# Patient Record
Sex: Female | Born: 1985 | Race: Black or African American | Hispanic: No | Marital: Single | Smoking: Never smoker
Health system: Southern US, Community
[De-identification: ages and names within clinical notes are randomized; demographics above are authoritative.]

## PROBLEM LIST (undated history)

## (undated) ENCOUNTER — Inpatient Hospital Stay (HOSPITAL_COMMUNITY): Payer: Self-pay

## (undated) DIAGNOSIS — O09893 Supervision of other high risk pregnancies, third trimester: Secondary | ICD-10-CM

## (undated) DIAGNOSIS — O0930 Supervision of pregnancy with insufficient antenatal care, unspecified trimester: Secondary | ICD-10-CM

## (undated) DIAGNOSIS — O99810 Abnormal glucose complicating pregnancy: Secondary | ICD-10-CM

## (undated) DIAGNOSIS — B009 Herpesviral infection, unspecified: Secondary | ICD-10-CM

## (undated) HISTORY — PX: NO PAST SURGERIES: SHX2092

## (undated) HISTORY — DX: Supervision of pregnancy with insufficient antenatal care, unspecified trimester: O09.30

## (undated) HISTORY — DX: Supervision of other high risk pregnancies, third trimester: O09.893

## (undated) HISTORY — DX: Abnormal glucose complicating pregnancy: O99.810

## (undated) HISTORY — DX: Herpesviral infection, unspecified: B00.9

---

## 2015-01-01 ENCOUNTER — Encounter (HOSPITAL_COMMUNITY): Payer: Self-pay | Admitting: Emergency Medicine

## 2015-01-01 ENCOUNTER — Emergency Department (HOSPITAL_COMMUNITY): Payer: Medicaid Other

## 2015-01-01 ENCOUNTER — Emergency Department (HOSPITAL_COMMUNITY)
Admission: EM | Admit: 2015-01-01 | Discharge: 2015-01-01 | Disposition: A | Discharge status: Home / Self Care | Payer: Medicaid Other | Attending: Emergency Medicine | Admitting: Emergency Medicine

## 2015-01-01 ENCOUNTER — Emergency Department (HOSPITAL_COMMUNITY): Payer: Self-pay

## 2015-01-01 DIAGNOSIS — R1032 Left lower quadrant pain: Secondary | ICD-10-CM | POA: Insufficient documentation

## 2015-01-01 DIAGNOSIS — O99213 Obesity complicating pregnancy, third trimester: Secondary | ICD-10-CM | POA: Insufficient documentation

## 2015-01-01 DIAGNOSIS — O9989 Other specified diseases and conditions complicating pregnancy, childbirth and the puerperium: Secondary | ICD-10-CM | POA: Insufficient documentation

## 2015-01-01 DIAGNOSIS — E669 Obesity, unspecified: Secondary | ICD-10-CM | POA: Insufficient documentation

## 2015-01-01 DIAGNOSIS — R1031 Right lower quadrant pain: Secondary | ICD-10-CM | POA: Insufficient documentation

## 2015-01-01 DIAGNOSIS — R102 Pelvic and perineal pain: Secondary | ICD-10-CM

## 2015-01-01 DIAGNOSIS — R52 Pain, unspecified: Secondary | ICD-10-CM

## 2015-01-01 DIAGNOSIS — O26899 Other specified pregnancy related conditions, unspecified trimester: Secondary | ICD-10-CM

## 2015-01-01 DIAGNOSIS — R109 Unspecified abdominal pain: Secondary | ICD-10-CM

## 2015-01-01 DIAGNOSIS — Z590 Homelessness: Secondary | ICD-10-CM | POA: Insufficient documentation

## 2015-01-01 LAB — COMPREHENSIVE METABOLIC PANEL
ALK PHOS: 65 U/L (ref 38–126)
ALT: 77 U/L — ABNORMAL HIGH (ref 14–54)
ANION GAP: 15 (ref 5–15)
AST: 59 U/L — ABNORMAL HIGH (ref 15–41)
Albumin: 4.5 g/dL (ref 3.5–5.0)
BUN: 12 mg/dL (ref 6–20)
CALCIUM: 9.9 mg/dL (ref 8.9–10.3)
CO2: 22 mmol/L (ref 22–32)
Chloride: 97 mmol/L — ABNORMAL LOW (ref 101–111)
Creatinine, Ser: 0.99 mg/dL (ref 0.44–1.00)
GFR calc non Af Amer: >60 mL/min (ref >60)
Glucose, Bld: 405 mg/dL — ABNORMAL HIGH (ref 65–99)
Potassium: 4.5 mmol/L (ref 3.5–5.1)
Sodium: 134 mmol/L — ABNORMAL LOW (ref 135–145)
TOTAL PROTEIN: 7.3 g/dL (ref 6.5–8.1)
Total Bilirubin: 1.3 mg/dL — ABNORMAL HIGH (ref 0.3–1.2)

## 2015-01-01 LAB — TYPE AND SCREEN
ABO/RH(D): O POS
ANTIBODY SCREEN: NEGATIVE

## 2015-01-01 LAB — RAPID HIV SCREEN (HIV 1/2 AB+AG)
HIV 1/2 Antibodies: NONREACTIVE
HIV-1 P24 Antigen - HIV24: NONREACTIVE

## 2015-01-01 LAB — URINALYSIS, ROUTINE W REFLEX MICROSCOPIC
Glucose, UA: NEGATIVE mg/dL
Hgb urine dipstick: NEGATIVE
Ketones, ur: 40 mg/dL — AB
NITRITE: NEGATIVE
Protein, ur: NEGATIVE mg/dL
SPECIFIC GRAVITY, URINE: 1.028 (ref 1.005–1.030)
pH: 6 (ref 5.0–8.0)

## 2015-01-01 LAB — CBC
HCT: 46.9 % — ABNORMAL HIGH (ref 36.0–46.0)
HEMOGLOBIN: 16.7 g/dL — AB (ref 12.0–15.0)
MCH: 30.6 pg (ref 26.0–34.0)
MCHC: 35.6 g/dL (ref 30.0–36.0)
MCV: 85.9 fL (ref 78.0–100.0)
Platelets: 265 10*3/uL (ref 150–400)
RBC: 5.46 MIL/uL — AB (ref 3.87–5.11)
RDW: 12.7 % (ref 11.5–15.5)
WBC: 7.2 10*3/uL (ref 4.0–10.5)

## 2015-01-01 LAB — URINE MICROSCOPIC-ADD ON

## 2015-01-01 LAB — WET PREP, GENITAL
Clue Cells Wet Prep HPF POC: NONE SEEN
SPERM: NONE SEEN
TRICH WET PREP: NONE SEEN
WBC WET PREP: NONE SEEN
YEAST WET PREP: NONE SEEN

## 2015-01-01 LAB — ABO/RH: ABO/RH(D): O POS

## 2015-01-01 LAB — I-STAT BETA HCG BLOOD, ED (MC, WL, AP ONLY): HCG, QUANTITATIVE: 1000.7 m[IU]/mL — AB (ref <5)

## 2015-01-01 MED ORDER — SODIUM CHLORIDE 0.9 % IV BOLUS (SEPSIS)
1000.0000 mL | Freq: Once | INTRAVENOUS | Status: AC
  Administered 2015-01-01: 1000 mL via INTRAVENOUS

## 2015-01-01 MED ORDER — SODIUM CHLORIDE 0.9 % IV BOLUS (SEPSIS): 1000.0000 mL | Freq: Once | INTRAVENOUS | Status: DC

## 2015-01-01 MED ORDER — ACETAMINOPHEN 325 MG PO TABS
650.0000 mg | ORAL_TABLET | Freq: Once | ORAL | Status: AC
  Administered 2015-01-01: 650 mg via ORAL
  Filled 2015-01-01: qty 2

## 2015-01-01 NOTE — ED Notes (Signed)
Pt returned from ultrasound

## 2015-01-01 NOTE — Discharge Instructions (Signed)
[name],  Nice meeting you! Return to the emergency department if your abdominal pain gets worse or if you notice vaginal bleeding. You may take Tylenol for pain. Feel better soon!  S. Lane HackerNicole Keyleigh Manninen, PA-C

## 2015-01-01 NOTE — ED Notes (Signed)
Discharge instructions and follow up care reviewed with patient. Patient verbalized understanding. 

## 2015-01-01 NOTE — ED Notes (Signed)
Unable to capture tracing in obix; it continues to put tracing on hold every time RN tries to resume tracing; pt has c/o pelvic pain; states that it is a 10 when she walks; pt reports spotting since Friday; reports last intercourse was in May; reports no leaking of fluid, no itching or burning, no foul smell.

## 2015-01-01 NOTE — ED Provider Notes (Signed)
The patient is a morbidly obese 29 year old female who is pregnant with her sixth pregnancy, she has only recently come to establish some kind of medical follow-up but does not yet have an OB/GYN, she complains of pain in her lower pelvis over the last week, specifically on the right side, constant but worse when she stands, no urinary symptoms, no bowel symptoms, no vomiting, no fevers.  On exam she has a morbidly obese body habitus, she is pregnant with a palpable uterus above the umbilicus, she has tenderness minimally in the epigastrium but also in the right lower and left lower quadrant, there is no focality, no guarding, no peritoneal signs.  The patient's workup reveals that she has a 30 one week 2 day pregnancy in the uterus based on ultrasound, we will do fetal monitoring, will have to disposition based on fetal monitoring. Doubt appendicitis, more likely related to pregnancy.  Discuss with OB/GYN rapid response who has seen the patient, evaluated the fetal monitoring, there was no signs of contractions, the patient does not have urinary infection, she will be discharged home to follow up in the outpatient setting, she appears well, she is in agreement with the plan  Meds given in ED:  Medications  acetaminophen (TYLENOL) tablet 650 mg (650 mg Oral Given 01/01/15 1727)  sodium chloride 0.9 % bolus 1,000 mL (0 mLs Intravenous Stopped 01/01/15 1851)       Medical screening examination/treatment/procedure(s) were conducted as a shared visit with non-physician practitioner(s) and myself.  I personally evaluated the patient during the encounter.  Clinical Impression:   Final diagnoses:  Abdominal pain in pregnancy       Eber Hong'  Wyllow Seigler, MD 01/14/15 [phone]

## 2015-01-01 NOTE — ED Notes (Signed)
PA at bedside.

## 2015-01-01 NOTE — ED Notes (Signed)
Patient transported to Ultrasound 

## 2015-01-01 NOTE — ED Notes (Signed)
Nurse drawing labs. 

## 2015-01-01 NOTE — Progress Notes (Signed)
Medicaid family planning [place]: : [id number] (main) [email or web address]://dma.ncdhhs.gov/- As a Medicaid client you MUST contact DSS/SSI each time you change address, move to another county or another state to keep your address updated please review  https://scott-[email or web address]/http://dma.ncdhhs.gov/medicaid/get-started/find-programs-and-services/be-smart-family-planning-program This provides information on what is and is not covered

## 2015-01-01 NOTE — ED Notes (Signed)
OB rapid response notified. States they will come evaluate this patient.

## 2015-01-01 NOTE — ED Notes (Signed)
RROB spoke with Dr Lynetta MareAnyawu and told of fhr reactive, no contractions, labs performed and results from the u/a and wetprep; sve=closed/40, no blood or brown discharge on RN's glove after sve; orders that pt may be discharged with preterm labor precautions, pt needs to make an appointment with the health department or [facility]; RN gave preterm labor precautions and told to follow up at either place she would like; pt verbalized understanding.

## 2015-01-01 NOTE — ED Notes (Signed)
Per pt, states abdominal pain since last Friday-states she has been spotting-states last period in May-no prenatal care due to "moving a lot"

## 2015-01-01 NOTE — ED Notes (Signed)
OB rapid response at bedside 

## 2015-01-01 NOTE — ED Provider Notes (Signed)
CSN: [id number]     Arrival date & time 01/01/15  1034 History   First MD Initiated Contact with Patient 01/01/15 1504     Chief Complaint  Patient presents with  . Abdominal Pain    HPI   Melissa Kelly is a 29 y.o. F with no significant PMH presenting with a lower abdominal pain since last Friday. She describes her pain as constant, 6/10 pain scale, achy, bilateral lower abdominal but worse on the right. She has 5 living children, present at bedside, and she states this pain is similar to her previous pregnancies but more severe. She denies fevers, chills, CP, SOB, N/V, changes in bowel/bladder habits, prenatal care (she is homeless), alleviation attempts.   History reviewed. No pertinent past medical history. History reviewed. No pertinent past surgical history. No family history on file. Social History  Substance Use Topics  . Smoking status: Never Smoker   . Smokeless tobacco: None  . Alcohol Use: No   OB History    Gravida Para Term Preterm AB TAB SAB Ectopic Multiple Living   7 5 5  0 1 0 1 0 0 5     Review of Systems  Ten systems are reviewed and are negative for acute change except as noted in the HPI   Allergies  Review of patient's allergies indicates no known allergies.  Home Medications   Prior to Admission medications   Medication Sig Start Date End Date Taking? Authorizing Provider  ibuprofen (ADVIL,MOTRIN) 200 MG tablet Take 400 mg by mouth every 6 (six) hours as needed for headache or moderate pain.   Yes Historical Provider, MD   BP 126/71 mmHg  Pulse 70  Temp(Src) 98.2 F (36.8 C) (Oral)  Resp 18  SpO2 100%  LMP 07/01/2014 Physical Exam  Constitutional: She appears well-developed and well-nourished. No distress.  Obese  HENT:  Head: Normocephalic and atraumatic.  Mouth/Throat: Oropharynx is clear and moist. No oropharyngeal exudate.  Eyes: Conjunctivae are normal. Pupils are equal, round, and reactive to light. Right eye exhibits no  discharge. Left eye exhibits no discharge. No scleral icterus.  Neck: No tracheal deviation present.  Cardiovascular: Normal rate, regular rhythm, normal heart sounds and intact distal pulses.  Exam reveals no gallop and no friction rub.   No murmur heard. Pulmonary/Chest: Effort normal and breath sounds normal. No respiratory distress. She has no wheezes. She has no rales. She exhibits no tenderness.  Abdominal: Soft. Bowel sounds are normal. She exhibits no distension and no mass. There is tenderness. There is no rebound and no guarding.  RLQ and LLQ ttp.   Genitourinary:  Uterine fundus correlating with 20+ week gestation. Performed pelvic without speculum. No blood or discharge visualized on glove. Cervical os closed.    Musculoskeletal: She exhibits no edema.  Lymphadenopathy:    She has no cervical adenopathy.  Neurological: She is alert. Coordination normal.  Skin: Skin is warm and dry. No rash noted. She is not diaphoretic. No erythema.  Psychiatric: She has a normal mood and affect. Her behavior is normal.  Nursing note and vitals reviewed.   ED Course  Procedures  Labs Review Labs Reviewed  COMPREHENSIVE METABOLIC PANEL - Abnormal; Notable for the following:    Sodium 134 (*)    Chloride 97 (*)    Glucose, Bld 405 (*)    AST 59 (*)    ALT 77 (*)    Total Bilirubin 1.3 (*)    All other components within normal limits  CBC -  Abnormal; Notable for the following:    RBC 5.46 (*)    Hemoglobin 16.7 (*)    HCT 46.9 (*)    All other components within normal limits  I-STAT BETA HCG BLOOD, ED ([facility], [facility], [facility] ONLY) - Abnormal; Notable for the following:    I-stat hCG, quantitative 1000.7 (*)    All other components within normal limits  URINALYSIS, ROUTINE W REFLEX MICROSCOPIC (NOT AT [facility])  TYPE AND SCREEN    Imaging Review US Ob Limited  01/01/2015  CLINICAL DATA:  Abdominal pain in third trimester pregnancy. EXAM: LIMITED OBSTETRIC ULTRASOUND FINDINGS: Number of Fetuses: 1  Heart Rate:  136 bpm Movement: Yes per sonographer exam Presentation: Cephalic Placental Location: Fundal.  No evidence of subplacental collection. Previa: No Amniotic Fluid (Subjective):  Within normal limits. BPD:  7.8cm 31w  2d MATERNAL FINDINGS: Cervix:  Length uncertain due to shadowing from fetal cranium. Uterus/Adnexae: Ovaries not visualized. No pathologic findings in the adnexa. IMPRESSION: 1. Negative, as above. 2. This exam is performed on an emergent basis and does not comprehensively evaluate fetal size, dating, or anatomy; follow-up complete OB US should be considered if further fetal assessment is warranted. Electronically Signed   By: Marnee Spring M.D.   On: 01/01/2015 15:49   I have personally reviewed and evaluated these images and lab results as part of my medical decision-making.   MDM   Final diagnoses:  Pelvic pain in female  Pain   Patient non-toxic appearing and VSS. Patient in no apparent distress. Based on patient history and physical exam, most likely etiologies are pregnancy related abdominal pains, contractions. Less likely etiologies are appendicitis, ruptured ectopic pregnancy, ovarian torsion, ovarian cyst, PID/TOA, kidney stone, psoas abscess. Will give fluids, tylenol, and [place], as well as order fetal monitoring.   US demonstrates 31 week, 2 day fetus, unremarkable otherwise. Per RN note, fetal HR reactive, no contractions. Patient may be discharged with preterm labor precautions. Upon reevaluation, patient is feeling better. Stressed importance of OP follow-up with obstetrics.  Patient requesting a note for her to stay in her facility during the day (they tell guests to leave during the day) because it is difficult for her to walk. Unfortunately, I am unable to provide this for her, as her workup with [place] today does not indicate bedrest.  Patient may be safely discharged home. Discussed reasons for return. Patient to follow-up with obstetrics as soon as possible.  Patient in understanding and agreement with the plan.    Melton Krebs, PA-C 01/11/15 1552  Eber Hong, MD 01/14/15 [phone]

## 2015-01-02 LAB — GC/CHLAMYDIA PROBE AMP (~~LOC~~) NOT AT ARMC
CHLAMYDIA, DNA PROBE: NEGATIVE
Neisseria Gonorrhea: NEGATIVE

## 2015-01-02 LAB — RPR: RPR: NONREACTIVE

## 2015-01-03 ENCOUNTER — Encounter: Payer: Medicaid Other | Admitting: Student

## 2015-01-03 LAB — RUBELLA SCREEN: RUBELLA: 4.89 {index} (ref >0.99)

## 2015-01-03 LAB — HEPATITIS B SURFACE ANTIGEN: Hepatitis B Surface Ag: NEGATIVE

## 2015-01-15 DIAGNOSIS — O99280 Endocrine, nutritional and metabolic diseases complicating pregnancy, unspecified trimester: Secondary | ICD-10-CM

## 2015-01-15 DIAGNOSIS — R42 Dizziness and giddiness: Secondary | ICD-10-CM

## 2015-01-15 DIAGNOSIS — E639 Nutritional deficiency, unspecified: Secondary | ICD-10-CM

## 2015-01-16 ENCOUNTER — Encounter: Payer: Self-pay | Admitting: Advanced Practice Midwife

## 2015-01-16 ENCOUNTER — Ambulatory Visit (INDEPENDENT_AMBULATORY_CARE_PROVIDER_SITE_OTHER): Payer: Self-pay | Admitting: Advanced Practice Midwife

## 2015-01-16 VITALS — BP 139/88 | HR 89 | Temp 97.7°F | Ht 68.0 in | Wt 346.5 lb

## 2015-01-16 DIAGNOSIS — O09893 Supervision of other high risk pregnancies, third trimester: Secondary | ICD-10-CM

## 2015-01-16 DIAGNOSIS — O0930 Supervision of pregnancy with insufficient antenatal care, unspecified trimester: Secondary | ICD-10-CM

## 2015-01-16 DIAGNOSIS — O99810 Abnormal glucose complicating pregnancy: Secondary | ICD-10-CM

## 2015-01-16 DIAGNOSIS — Z8619 Personal history of other infectious and parasitic diseases: Secondary | ICD-10-CM

## 2015-01-16 DIAGNOSIS — Z8279 Family history of other congenital malformations, deformations and chromosomal abnormalities: Secondary | ICD-10-CM

## 2015-01-16 DIAGNOSIS — O0933 Supervision of pregnancy with insufficient antenatal care, third trimester: Secondary | ICD-10-CM

## 2015-01-16 DIAGNOSIS — Z23 Encounter for immunization: Secondary | ICD-10-CM

## 2015-01-16 HISTORY — DX: Supervision of other high risk pregnancies, third trimester: O09.893

## 2015-01-16 HISTORY — DX: Supervision of pregnancy with insufficient antenatal care, unspecified trimester: O09.30

## 2015-01-16 HISTORY — DX: Abnormal glucose complicating pregnancy: O99.810

## 2015-01-16 LAB — POCT URINALYSIS DIP (DEVICE)
GLUCOSE, UA: NEGATIVE mg/dL
HGB URINE DIPSTICK: NEGATIVE
Ketones, ur: >=160 mg/dL — AB
LEUKOCYTES UA: NEGATIVE
NITRITE: NEGATIVE
Protein, ur: 30 mg/dL — AB
Specific Gravity, Urine: >=1.03 (ref 1.005–1.030)
Urobilinogen, UA: 1 mg/dL (ref 0.0–1.0)
pH: 6 (ref 5.0–8.0)

## 2015-01-16 MED ORDER — ACYCLOVIR 200 MG PO CAPS: 400.0000 mg | ORAL_CAPSULE | Freq: Two times a day (BID) | ORAL | Status: DC

## 2015-01-16 MED ORDER — TETANUS-DIPHTH-ACELL PERTUSSIS 5-2.5-18.5 LF-MCG/0.5 IM SUSP
0.5000 mL | Freq: Once | INTRAMUSCULAR | Status: AC
  Administered 2015-01-16: 0.5 mL via INTRAMUSCULAR

## 2015-01-16 NOTE — Progress Notes (Signed)
1 hr GTT today.  Pt declines Flu vac, agrees to Tdap.

## 2015-01-16 NOTE — Congregational Nurse Program (Signed)
Initial  Visit  For this mother of  5 and is expecting her 6th child. EDC is 02-24-15. States  When ask has she made arrangements for  Her children to  Be taken care of if she  Goes to hospital for early  Or illness. She states I dont know  Any one , I have no friends here . [place]oved here 1 [place] ago . [place] and he will come when needed but  That may be after baby is delievered. Nurse tried to emphasize how  Important planning for her children is as her due  Date is very soon. Nurse talked with case managenement about this situation and they are awre of need. DSS would  Have to  Be called. i talked  With the mother about  DSS custody if  No plans are made and she stated I hate that. Has not been eating ,states just isnt hungry  And saves what  She  Has in her room for the children. Nurse will let  Case management know  And they stated some  Foods  Could  Be provide for family  To supplement meals in cafeteria .All members eating in the cafeteria.  Nurse counseled about the importance of  Nutrition for mother and baby.  Blood  Sugar taken 66 mg at  4;30 pm . Gave  Mom what  Normal blood sugar  And signs and symptoms of  Low sugar  which  She  was experiencing  . To  Eat a good dinner.  This  29  Year old  Mother of  5 children now  Seems depressed and  overwhelmed . Please elevate for the  possibly of  Depression  during pregnancy due to  Move ,no support system .  Nurse  made arrangements for  Family  To  Receive  Free coats for the  winter and provided a taxi voucher [facility]  [facility] and [facility].

## 2015-01-16 NOTE — Progress Notes (Signed)
Subjective:    Melissa HarnessSharlyne Kelly is a [id number] 1543w3d being seen today for her first obstetrical visit.  Her obstetrical history is significant for morbid obesity and late to care. [facility] Patient does intend to breast feed. Pregnancy history fully reviewed.  [facility]. Had prenatal panel drawn GC/Chlamydia collected. Random serum glucose was 405. Denies Hx DM before or during pregnancy.   Patient reports no complaints.  Filed Vitals:   01/16/15 0759 01/16/15 0801  BP: 139/88   Pulse: 89   Temp: 97.7 F (36.5 C)   Height:  5\' 8"  (1.727 m)  Weight: 346 lb 8 oz (157.171 kg)     HISTORY: OB History  Gravida Para Term Preterm AB SAB TAB Ectopic Multiple Living  7 5 5  0 1 1 0 0 0 5    # Outcome Date GA Lbr Len/2nd Weight Sex Delivery Anes PTL Lv  7 Current           6 SAB           5 Term           4 Term           3 Term           2 Term           1 Term              Past Medical History  Diagnosis Date  . Medical history non-contributory   . Herpes    History reviewed. No pertinent past surgical history. Family History  Problem Relation Age of Onset  . Tuberculosis Mother   . Stroke Father      Exam  BP 139/88 mmHg  Pulse 89  Temp(Src) 97.7 F (36.5 C)  Ht 5\' 8"  (1.727 m)  Wt 346 lb 8 oz (157.171 kg)  BMI 52.70 kg/m2  LMP 07/01/2014  NST reactive  Uterus:   36 cm fundal height  Pelvic Exam: deferred   Bony Pelvis: proven  System: Breast:  declined   Skin: normal coloration and turgor, no rashes    Neurologic: oriented, normal mood, grossly non-focal   Extremities: normal strength, tone, and muscle mass   HEENT sclera clear, anicteric   Mouth/Teeth mucous membranes moist, pharynx normal without lesions and dental hygiene good   Cardiovascular: regular rate and rhythm, no murmurs or gallops   Respiratory:  appears well, vitals normal, no respiratory distress, acyanotic, normal  RR, chest clear, no wheezing, crepitations, rhonchi, normal symmetric air entry   Abdomen: soft, non-tender; bowel sounds normal; no masses,  no organomegaly      Assessment:    Pregnancy: [id number]   1. Supervision of other high risk pregnancies, third trimester  - Glucose Tolerance, 1 HR (50g) - Culture, OB Urine - Prescript Monitor Profile(19) - US MFM OB COMP + 14 WK; Future - Hemoglobinopathy evaluation  2. Late prenatal care, third trimester  - US MFM OB COMP + 14 WK; Future - Fetal nonstress test - US MFM FETAL BPP WO NON STRESS; Future  3. Hyperglycemia during pregnancy  - Hemoglobin A1c - Fetal nonstress test - US MFM FETAL BPP WO NON STRESS; Future  4. History of herpes genitalis  - acyclovir (ZOVIRAX) 200 MG capsule; Take 2 capsules (400 mg total) by mouth 2 (two) times daily.  Dispense: 60 capsule; Refill: 6  5. FH of cardiac defect  - Will schedule fetal  echo  Plan:     Initial labs reviewed. Prenatal vitamins. Problem list reviewed and updated. Genetic Screening discussed Quad Screen: too late.  Ultrasound discussed; fetal survey: ordered.  Follow up in 1 weeks. Meet w/ DM educator 01/21/15 for probable GDM per consult w/ Dr. Macon Large. NST reactive today  [name] 01/16/2015

## 2015-01-17 DIAGNOSIS — Z8279 Family history of other congenital malformations, deformations and chromosomal abnormalities: Secondary | ICD-10-CM | POA: Insufficient documentation

## 2015-01-17 LAB — HEMOGLOBIN A1C
Hgb A1c MFr Bld: 5.1 % (ref <5.7)
MEAN PLASMA GLUCOSE: 100 mg/dL (ref <117)

## 2015-01-17 LAB — GLUCOSE TOLERANCE, 1 HOUR (50G) W/O FASTING: GLUCOSE 1 HOUR GTT: 101 mg/dL (ref 70–140)

## 2015-01-18 ENCOUNTER — Telehealth: Payer: Self-pay | Admitting: General Practice

## 2015-01-18 LAB — PRESCRIPTION MONITORING PROFILE (19 PANEL)
Amphetamine/Meth: NEGATIVE ng/mL
BARBITURATE SCREEN, URINE: NEGATIVE ng/mL
Benzodiazepine Screen, Urine: NEGATIVE ng/mL
Buprenorphine, Urine: NEGATIVE ng/mL
CARISOPRODOL, URINE: NEGATIVE ng/mL
COCAINE METABOLITES: NEGATIVE ng/mL
CREATININE, URINE: 376.79 mg/dL (ref >20.0)
Cannabinoid Scrn, Ur: NEGATIVE ng/mL
Fentanyl, Ur: NEGATIVE ng/mL
MDMA URINE: NEGATIVE ng/mL
Meperidine, Ur: NEGATIVE ng/mL
Methadone Screen, Urine: NEGATIVE ng/mL
Methaqualone: NEGATIVE ng/mL
Nitrites, Initial: NEGATIVE ug/mL
OPIATE SCREEN, URINE: NEGATIVE ng/mL
OXYCODONE SCRN UR: NEGATIVE ng/mL
PH URINE, INITIAL: 5.8 pH (ref 4.5–8.9)
PROPOXYPHENE: NEGATIVE ng/mL
Phencyclidine, Ur: NEGATIVE ng/mL
TAPENTADOLUR: NEGATIVE ng/mL
TRAMADOL UR: NEGATIVE ng/mL
Zolpidem, Urine: NEGATIVE ng/mL

## 2015-01-18 LAB — CULTURE, OB URINE

## 2015-01-18 NOTE — Telephone Encounter (Signed)
Per Melissa KinsmanVirginia Kelly, patient needs fetal echo due to family history of heart defect. Fetal echo scheduled 12/22 @ 1pm with [facility]. Called patient & informed her of appt and provided office information. Patient verbalized understanding & had no questions

## 2015-01-21 ENCOUNTER — Ambulatory Visit: Payer: Self-pay

## 2015-01-21 LAB — HEMOGLOBINOPATHY EVALUATION
HGB A: 97.6 % (ref 96.8–97.8)
HGB S QUANTITAION: 0 %
Hemoglobin Other: 0 %
Hgb A2 Quant: 2.4 % (ref 2.2–3.2)
Hgb F Quant: 0 % (ref 0.0–2.0)

## 2015-01-22 ENCOUNTER — Ambulatory Visit (HOSPITAL_COMMUNITY)
Admission: RE | Admit: 2015-01-22 | Discharge: 2015-01-22 | Disposition: A | Discharge status: Home / Self Care | Payer: Self-pay | Source: Ambulatory Visit | Attending: Advanced Practice Midwife | Admitting: Advanced Practice Midwife

## 2015-01-23 ENCOUNTER — Encounter: Payer: Self-pay | Admitting: Family Medicine

## 2015-01-23 ENCOUNTER — Other Ambulatory Visit: Payer: Self-pay | Admitting: Advanced Practice Midwife

## 2015-01-23 ENCOUNTER — Ambulatory Visit (HOSPITAL_COMMUNITY)
Admission: RE | Admit: 2015-01-23 | Discharge: 2015-01-23 | Disposition: A | Discharge status: Home / Self Care | Payer: Medicaid Other | Source: Ambulatory Visit | Attending: Advanced Practice Midwife | Admitting: Advanced Practice Midwife

## 2015-01-23 DIAGNOSIS — O99213 Obesity complicating pregnancy, third trimester: Secondary | ICD-10-CM | POA: Diagnosis not present

## 2015-01-23 DIAGNOSIS — O0933 Supervision of pregnancy with insufficient antenatal care, third trimester: Secondary | ICD-10-CM

## 2015-01-23 DIAGNOSIS — Z3A34 34 weeks gestation of pregnancy: Secondary | ICD-10-CM | POA: Insufficient documentation

## 2015-01-23 DIAGNOSIS — O99810 Abnormal glucose complicating pregnancy: Secondary | ICD-10-CM

## 2015-01-23 DIAGNOSIS — Z5900 Homelessness unspecified: Secondary | ICD-10-CM | POA: Insufficient documentation

## 2015-01-23 DIAGNOSIS — O09893 Supervision of other high risk pregnancies, third trimester: Secondary | ICD-10-CM

## 2015-01-23 DIAGNOSIS — Z36 Encounter for antenatal screening of mother: Secondary | ICD-10-CM | POA: Diagnosis not present

## 2015-01-29 ENCOUNTER — Other Ambulatory Visit: Payer: Self-pay

## 2015-02-03 NOTE — L&D Delivery Note (Signed)
    Delivery Note As soon as the test dose for the epidural went in, pt c/o urge to push. Sure enough, she was C/C/+3.  After a 2 contraction 2nd stage, At 3:18 AM a viable female was delivered via  (Presentation:LOA  ).  APGAR: 8/9, ; weight pending .  After 2 minutes, the cord was clamped and cut. 40 units of pitocin diluted in 1000cc LR was infused rapidly IV.  The placenta separated spontaneously and delivered via CCT and maternal pushing effort.  It was inspected and appears to be intact with a 3 VC.    Anesthesia:  none Episiotomy:  none Lacerations:  none Suture Repair: n/a Est. Blood Loss (mL):  50  Mom to postpartum.  Baby to Couplet care / Skin to Skin   All of the above by Dr. Timoteo Gaul under my direct supervision.   CRESENZO-DISHMAN,Dshaun Reppucci 03/15/2015, 3:21 AM

## 2015-02-04 ENCOUNTER — Ambulatory Visit: Payer: Self-pay

## 2015-02-06 ENCOUNTER — Encounter: Payer: Self-pay | Admitting: Family Medicine

## 2015-02-11 ENCOUNTER — Ambulatory Visit: Payer: Self-pay

## 2015-02-11 ENCOUNTER — Encounter: Payer: Medicaid Other | Admitting: Obstetrics and Gynecology

## 2015-02-18 ENCOUNTER — Encounter: Payer: Medicaid Other | Admitting: Obstetrics and Gynecology

## 2015-03-08 ENCOUNTER — Inpatient Hospital Stay (HOSPITAL_COMMUNITY)
Admission: AD | Admit: 2015-03-08 | Discharge: 2015-03-08 | Disposition: A | Discharge status: Home / Self Care | Payer: Medicaid Other | Source: Ambulatory Visit | Attending: Family Medicine | Admitting: Family Medicine

## 2015-03-08 ENCOUNTER — Encounter (HOSPITAL_COMMUNITY): Payer: Self-pay | Admitting: *Deleted

## 2015-03-08 DIAGNOSIS — Z3493 Encounter for supervision of normal pregnancy, unspecified, third trimester: Secondary | ICD-10-CM | POA: Insufficient documentation

## 2015-03-08 NOTE — MAU Note (Signed)
Pt states contractions started yesterday and got more regular around 0600 this morning.  Pt states the pain is mostly in her back.  Pt states she does not feel like her water broke.  Pt states she is feeling the baby move.  Pt states she goes to the clinic downstairs.

## 2015-03-08 NOTE — Progress Notes (Signed)
Dr. Ottie Glazier states she discussed the plan of care with Philipp Deputy, CNM and states the pt can be discharged when her strip is reactive.

## 2015-03-08 NOTE — Progress Notes (Signed)
Provider notified of pt arrival to MAU.  Notified that pt is a G7P5 and is 40w 5d.  Notified that pt started prenatal care on 12/14 in the 3rd trimester of pregnancy.  Notified that BMI is 52.69 and pt has a history of herpes.  Notified that cervical exam is 3cm, 50%, and -3.  Notified that pt has traced one contraction and is irritable.  Dr. Ottie Glazier, the resident, states she will talk to Sharen Counter, CNM and determine the plan of care for the pt.

## 2015-03-08 NOTE — Discharge Instructions (Signed)
Third Trimester of Pregnancy °The third trimester is from week 29 through week 42, months 7 through 9. The third trimester is a time when the fetus is growing rapidly. At the end of the ninth month, the fetus is about 20 inches in length and weighs 6-10 pounds.  °BODY CHANGES °Your body goes through many changes during pregnancy. The changes vary from woman to woman.  °· Your weight will continue to increase. You can expect to gain 25-35 pounds (11-16 kg) by the end of the pregnancy. °· You may begin to get stretch marks on your hips, abdomen, and breasts. °· You may urinate more often because the fetus is moving lower into your pelvis and pressing on your bladder. °· You may develop or continue to have heartburn as a result of your pregnancy. °· You may develop constipation because certain hormones are causing the muscles that push waste through your intestines to slow down. °· You may develop hemorrhoids or swollen, bulging veins (varicose veins). °· You may have pelvic pain because of the weight gain and pregnancy hormones relaxing your joints between the bones in your pelvis. Backaches may result from overexertion of the muscles supporting your posture. °· You may have changes in your hair. These can include thickening of your hair, rapid growth, and changes in texture. Some women also have hair loss during or after pregnancy, or hair that feels dry or thin. Your hair will most likely return to normal after your baby is born. °· Your breasts will continue to grow and be tender. A yellow discharge may leak from your breasts called colostrum. °· Your belly button may stick out. °· You may feel short of breath because of your expanding uterus. °· You may notice the fetus "dropping," or moving lower in your abdomen. °· You may have a bloody mucus discharge. This usually occurs a few days to a week before labor begins. °· Your cervix becomes thin and soft (effaced) near your due date. °WHAT TO EXPECT AT YOUR PRENATAL  EXAMS  °You will have prenatal exams every 2 weeks until week 36. Then, you will have weekly prenatal exams. During a routine prenatal visit: °· You will be weighed to make sure you and the fetus are growing normally. °· Your blood pressure is taken. °· Your abdomen will be measured to track your baby's growth. °· The fetal heartbeat will be listened to. °· Any test results from the previous visit will be discussed. °· You may have a cervical check near your due date to see if you have effaced. °At around 36 weeks, your caregiver will check your cervix. At the same time, your caregiver will also perform a test on the secretions of the vaginal tissue. This test is to determine if a type of bacteria, Group B streptococcus, is present. Your caregiver will explain this further. °Your caregiver may ask you: °· What your birth plan is. °· How you are feeling. °· If you are feeling the baby move. °· If you have had any abnormal symptoms, such as leaking fluid, bleeding, severe headaches, or abdominal cramping. °· If you are using any tobacco products, including cigarettes, chewing tobacco, and electronic cigarettes. °· If you have any questions. °Other tests or screenings that may be performed during your third trimester include: °· Blood tests that check for low iron levels (anemia). °· Fetal testing to check the health, activity level, and growth of the fetus. Testing is done if you have certain medical conditions or if   there are problems during the pregnancy. °· HIV (human immunodeficiency virus) testing. If you are at high risk, you may be screened for HIV during your third trimester of pregnancy. °FALSE LABOR °You may feel small, irregular contractions that eventually go away. These are called Braxton Hicks contractions, or false labor. Contractions may last for hours, days, or even weeks before true labor sets in. If contractions come at regular intervals, intensify, or become painful, it is best to be seen by your  caregiver.  °SIGNS OF LABOR  °· Menstrual-like cramps. °· Contractions that are 5 minutes apart or less. °· Contractions that start on the top of the uterus and spread down to the lower abdomen and back. °· A sense of increased pelvic pressure or back pain. °· A watery or bloody mucus discharge that comes from the vagina. °If you have any of these signs before the 37th week of pregnancy, call your caregiver right away. You need to go to the hospital to get checked immediately. °HOME CARE INSTRUCTIONS  °· Avoid all smoking, herbs, alcohol, and unprescribed drugs. These chemicals affect the formation and growth of the baby. °· Do not use any tobacco products, including cigarettes, chewing tobacco, and electronic cigarettes. If you need help quitting, ask your health care provider. You may receive counseling support and other resources to help you quit. °· Follow your caregiver's instructions regarding medicine use. There are medicines that are either safe or unsafe to take during pregnancy. °· Exercise only as directed by your caregiver. Experiencing uterine cramps is a good sign to stop exercising. °· Continue to eat regular, healthy meals. °· Wear a good support bra for breast tenderness. °· Do not use hot tubs, steam rooms, or saunas. °· Wear your seat belt at all times when driving. °· Avoid raw meat, uncooked cheese, cat litter boxes, and soil used by cats. These carry germs that can cause birth defects in the baby. °· Take your prenatal vitamins. °· Take 1500-2000 mg of calcium daily starting at the 20th week of pregnancy until you deliver your baby. °· Try taking a stool softener (if your caregiver approves) if you develop constipation. Eat more high-fiber foods, such as fresh vegetables or fruit and whole grains. Drink plenty of fluids to keep your urine clear or pale yellow. °· Take warm sitz baths to soothe any pain or discomfort caused by hemorrhoids. Use hemorrhoid cream if your caregiver approves. °· If  you develop varicose veins, wear support hose. Elevate your feet for 15 minutes, 3-4 times a day. Limit salt in your diet. °· Avoid heavy lifting, wear low heal shoes, and practice good posture. °· Rest a lot with your legs elevated if you have leg cramps or low back pain. °· Visit your dentist if you have not gone during your pregnancy. Use a soft toothbrush to brush your teeth and be gentle when you floss. °· A sexual relationship may be continued unless your caregiver directs you otherwise. °· Do not travel far distances unless it is absolutely necessary and only with the approval of your caregiver. °· Take prenatal classes to understand, practice, and ask questions about the labor and delivery. °· Make a trial run to the hospital. °· Pack your hospital bag. °· Prepare the baby's nursery. °· Continue to go to all your prenatal visits as directed by your caregiver. °SEEK MEDICAL CARE IF: °· You are unsure if you are in labor or if your water has broken. °· You have dizziness. °· You have   mild pelvic cramps, pelvic pressure, or nagging pain in your abdominal area. °· You have persistent nausea, vomiting, or diarrhea. °· You have a bad smelling vaginal discharge. °· You have pain with urination. °SEEK IMMEDIATE MEDICAL CARE IF:  °· You have a fever. °· You are leaking fluid from your vagina. °· You have spotting or bleeding from your vagina. °· You have severe abdominal cramping or pain. °· You have rapid weight loss or gain. °· You have shortness of breath with chest pain. °· You notice sudden or extreme swelling of your face, hands, ankles, feet, or legs. °· You have not felt your baby move in over an hour. °· You have severe headaches that do not go away with medicine. °· You have vision changes. °  °This information is not intended to replace advice given to you by your health care provider. Make sure you discuss any questions you have with your health care provider. °  °Document Released: 01/13/2001 Document  Revised: 02/09/2014 Document Reviewed: 03/22/2012 °[employer]vier Interactive Patient Education ©2016 [employer] °Fetal Movement Counts °Patient Name: __________________________________________________ Patient Due Date: ____________________ °Performing a fetal movement count is highly recommended in high-risk pregnancies, but it is good for every pregnant woman to do. Your health care provider may ask you to start counting fetal movements at 28 weeks of the pregnancy. Fetal movements often increase: °· After eating a full meal. °· After physical activity. °· After eating or drinking something sweet or cold. °· At rest. °Pay attention to when you feel the baby is most active. This will help you notice a pattern of your baby's sleep and wake cycles and what factors contribute to an increase in fetal movement. It is important to perform a fetal movement count at the same time each day when your baby is normally most active.  °HOW TO COUNT FETAL MOVEMENTS °· Find a quiet and comfortable area to sit or lie down on your left side. Lying on your left side provides the best blood and oxygen circulation to your baby. °· Write down the day and time on a sheet of paper or in a journal. °· Start counting kicks, flutters, swishes, rolls, or jabs in a 2-hour period. You should feel at least 10 movements within 2 hours. °· If you do not feel 10 movements in 2 hours, wait 2-3 hours and count again. Look for a change in the pattern or not enough counts in 2 hours. °SEEK MEDICAL CARE IF: °· You feel less than 10 counts in 2 hours, tried twice. °· There is no movement in over an hour. °· The pattern is changing or taking longer each day to reach 10 counts in 2 hours. °· You feel the baby is not moving as he or she usually does. °Date: ____________ Movements: ____________ Start time: ____________ Finish time: ____________  °Date: ____________ Movements: ____________ Start time: ____________ Finish time: ____________ °Date: ____________  Movements: ____________ Start time: ____________ Finish time: ____________ °Date: ____________ Movements: ____________ Start time: ____________ Finish time: ____________ °Date: ____________ Movements: ____________ Start time: ____________ Finish time: ____________ °Date: ____________ Movements: ____________ Start time: ____________ Finish time: ____________ °Date: ____________ Movements: ____________ Start time: ____________ Finish time: ____________ °Date: ____________ Movements: ____________ Start time: ____________ Finish time: ____________  °Date: ____________ Movements: ____________ Start time: ____________ Finish time: ____________ °Date: ____________ Movements: ____________ Start time: ____________ Finish time: ____________ °Date: ____________ Movements: ____________ Start time: ____________ Finish time: ____________ °Date: ____________ Movements: ____________ Start time: ____________ Finish time: ____________ °Date:   ____________ Movements: ____________ Start time: ____________ Finish time: ____________ °Date: ____________ Movements: ____________ Start time: ____________ Finish time: ____________ °Date: ____________ Movements: ____________ Start time: ____________ Finish time: ____________  °Date: ____________ Movements: ____________ Start time: ____________ Finish time: ____________ °Date: ____________ Movements: ____________ Start time: ____________ Finish time: ____________ °Date: ____________ Movements: ____________ Start time: ____________ Finish time: ____________ °Date: ____________ Movements: ____________ Start time: ____________ Finish time: ____________ °Date: ____________ Movements: ____________ Start time: ____________ Finish time: ____________ °Date: ____________ Movements: ____________ Start time: ____________ Finish time: ____________ °Date: ____________ Movements: ____________ Start time: ____________ Finish time: ____________  °Date: ____________ Movements: ____________ Start time:  ____________ Finish time: ____________ °Date: ____________ Movements: ____________ Start time: ____________ Finish time: ____________ °Date: ____________ Movements: ____________ Start time: ____________ Finish time: ____________ °Date: ____________ Movements: ____________ Start time: ____________ Finish time: ____________ °Date: ____________ Movements: ____________ Start time: ____________ Finish time: ____________ °Date: ____________ Movements: ____________ Start time: ____________ Finish time: ____________ °Date: ____________ Movements: ____________ Start time: ____________ Finish time: ____________  °Date: ____________ Movements: ____________ Start time: ____________ Finish time: ____________ °Date: ____________ Movements: ____________ Start time: ____________ Finish time: ____________ °Date: ____________ Movements: ____________ Start time: ____________ Finish time: ____________ °Date: ____________ Movements: ____________ Start time: ____________ Finish time: ____________ °Date: ____________ Movements: ____________ Start time: ____________ Finish time: ____________ °Date: ____________ Movements: ____________ Start time: ____________ Finish time: ____________ °Date: ____________ Movements: ____________ Start time: ____________ Finish time: ____________  °Date: ____________ Movements: ____________ Start time: ____________ Finish time: ____________ °Date: ____________ Movements: ____________ Start time: ____________ Finish time: ____________ °Date: ____________ Movements: ____________ Start time: ____________ Finish time: ____________ °Date: ____________ Movements: ____________ Start time: ____________ Finish time: ____________ °Date: ____________ Movements: ____________ Start time: ____________ Finish time: ____________ °Date: ____________ Movements: ____________ Start time: ____________ Finish time: ____________ °Date: ____________ Movements: ____________ Start time: ____________ Finish time: ____________  °Date:  ____________ Movements: ____________ Start time: ____________ Finish time: ____________ °Date: ____________ Movements: ____________ Start time: ____________ Finish time: ____________ °Date: ____________ Movements: ____________ Start time: ____________ Finish time: ____________ °Date: ____________ Movements: ____________ Start time: ____________ Finish time: ____________ °Date: ____________ Movements: ____________ Start time: ____________ Finish time: ____________ °Date: ____________ Movements: ____________ Start time: ____________ Finish time: ____________ °Date: ____________ Movements: ____________ Start time: ____________ Finish time: ____________  °Date: ____________ Movements: ____________ Start time: ____________ Finish time: ____________ °Date: ____________ Movements: ____________ Start time: ____________ Finish time: ____________ °Date: ____________ Movements: ____________ Start time: ____________ Finish time: ____________ °Date: ____________ Movements: ____________ Start time: ____________ Finish time: ____________ °Date: ____________ Movements: ____________ Start time: ____________ Finish time: ____________ °Date: ____________ Movements: ____________ Start time: ____________ Finish time: ____________ °  °This information is not intended to replace advice given to you by your health care provider. Make sure you discuss any questions you have with your health care provider. °  °Document Released: 02/18/2006 Document Revised: 02/09/2014 Document Reviewed: 11/16/2011 °[employer] Interactive Patient Education ©2016 [employer] °Braxton Hicks Contractions °Contractions of the uterus can occur throughout pregnancy. Contractions are not always a sign that you are in labor.  °WHAT ARE BRAXTON HICKS CONTRACTIONS?  °Contractions that occur before labor are called Braxton Hicks contractions, or false labor. Toward the end of pregnancy (32-34 weeks), these contractions can develop more often and may become more  forceful. This is not true labor because these contractions do not result in opening (dilatation) and thinning of the cervix. They are sometimes difficult to tell apart from true labor because these contractions can be forceful and people have different pain tolerances. You should   not feel embarrassed if you go to the hospital with false labor. Sometimes, the only way to tell if you are in true labor is for your health care provider to look for changes in the cervix. °If there are no prenatal problems or other health problems associated with the pregnancy, it is completely safe to be sent home with false labor and await the onset of true labor. °HOW CAN YOU TELL THE DIFFERENCE BETWEEN TRUE AND FALSE LABOR? °False Labor °· The contractions of false labor are usually shorter and not as hard as those of true labor.   °· The contractions are usually irregular.   °· The contractions are often felt in the front of the lower abdomen and in the groin.   °· The contractions may go away when you walk around or change positions while lying down.   °· The contractions get weaker and are shorter lasting as time goes on.   °· The contractions do not usually become progressively stronger, regular, and closer together as with true labor.   °True Labor °· Contractions in true labor last 30-70 seconds, become very regular, usually become more intense, and increase in frequency.   °· The contractions do not go away with walking.   °· The discomfort is usually felt in the top of the uterus and spreads to the lower abdomen and low back.   °· True labor can be determined by your health care provider with an exam. This will show that the cervix is dilating and getting thinner.   °WHAT TO REMEMBER °· Keep up with your usual exercises and follow other instructions given by your health care provider.   °· Take medicines as directed by your health care provider.   °· Keep your regular prenatal appointments.   °· Eat and drink lightly if you  think you are going into labor.   °· If Braxton Hicks contractions are making you uncomfortable:   °· Change your position from lying down or resting to walking, or from walking to resting.   °· Sit and rest in a tub of warm water.   °· Drink 2-3 glasses of water. Dehydration may cause these contractions.   °· Do slow and deep breathing several times an hour.   °WHEN SHOULD I SEEK IMMEDIATE MEDICAL CARE? °Seek immediate medical care if: °· Your contractions become stronger, more regular, and closer together.   °· You have fluid leaking or gushing from your vagina.   °· You have a fever.   °· You pass blood-tinged mucus.   °· You have vaginal bleeding.   °· You have continuous abdominal pain.   °· You have low back pain that you never had before.   °· You feel your baby's head pushing down and causing pelvic pressure.   °· Your baby is not moving as much as it used to.   °  °This information is not intended to replace advice given to you by your health care provider. Make sure you discuss any questions you have with your health care provider. °  °Document Released: 01/19/2005 Document Revised: 01/24/2013 Document Reviewed: 10/31/2012 °Elsevier Interactive Patient Education ©2016 [employer] ° °

## 2015-03-11 ENCOUNTER — Ambulatory Visit (INDEPENDENT_AMBULATORY_CARE_PROVIDER_SITE_OTHER): Payer: Medicaid Other | Admitting: Family Medicine

## 2015-03-11 ENCOUNTER — Encounter (HOSPITAL_COMMUNITY): Payer: Self-pay | Admitting: *Deleted

## 2015-03-11 ENCOUNTER — Telehealth (HOSPITAL_COMMUNITY): Payer: Self-pay | Admitting: *Deleted

## 2015-03-11 VITALS — BP 129/85 | HR 79 | Temp 98.2°F | Wt 345.1 lb

## 2015-03-11 DIAGNOSIS — O09893 Supervision of other high risk pregnancies, third trimester: Secondary | ICD-10-CM

## 2015-03-11 DIAGNOSIS — Z8619 Personal history of other infectious and parasitic diseases: Secondary | ICD-10-CM

## 2015-03-11 DIAGNOSIS — O0933 Supervision of pregnancy with insufficient antenatal care, third trimester: Secondary | ICD-10-CM

## 2015-03-11 NOTE — Progress Notes (Signed)
BTS form completed and copy to the patient. IOL scheduled for 03/14/2015 :30 AM per Dr Adrian Blackwater. Instructed on fetal kick counts. Advised to report to [facility] if decreased or no fetal movement felt prior to induction.

## 2015-03-11 NOTE — Patient Instructions (Signed)
Labor Induction Labor induction is when steps are taken to cause a pregnant woman to begin the labor process. Most women go into labor on their own between 37 weeks and 42 weeks of the pregnancy. When this does not happen or when there is a medical need, methods may be used to induce labor. Labor induction causes a pregnant woman's uterus to contract. It also causes the cervix to soften (ripen), open (dilate), and thin out (efface). Usually, labor is not induced before 39 weeks of the pregnancy unless there is a problem with the baby or mother.  Before inducing labor, your health care provider will consider a number of factors, including the following:  The medical condition of you and the baby.   How many weeks along you are.   The status of the baby's lung maturity.   The condition of the cervix.   The position of the baby.  WHAT ARE THE REASONS FOR LABOR INDUCTION? Labor may be induced for the following reasons:  The health of the baby or mother is at risk.   The pregnancy is overdue by 1 week or more.   The water breaks but labor does not start on its own.   The mother has a health condition or serious illness, such as high blood pressure, infection, placental abruption, or diabetes.  The amniotic fluid amounts are low around the baby.   The baby is distressed.  Convenience or wanting the baby to be born on a certain date is not a reason for inducing labor. WHAT METHODS ARE USED FOR LABOR INDUCTION? Several methods of labor induction may be used, such as:   Prostaglandin medicine. This medicine causes the cervix to dilate and ripen. The medicine will also start contractions. It can be taken by mouth or by inserting a suppository into the vagina.   Inserting a thin tube (catheter) with a balloon on the end into the vagina to dilate the cervix. Once inserted, the balloon is expanded with water, which causes the cervix to open.   Stripping the membranes. Your health  care provider separates amniotic sac tissue from the cervix, causing the cervix to be stretched and causing the release of a hormone called progesterone. This may cause the uterus to contract. It is often done during an office visit. You will be sent home to wait for the contractions to begin. You will then come in for an induction.   Breaking the water. Your health care provider makes a hole in the amniotic sac using a small instrument. Once the amniotic sac breaks, contractions should begin. This may still take hours to see an effect.   Medicine to trigger or strengthen contractions. This medicine is given through an IV access tube inserted into a vein in your arm.  All of the methods of induction, besides stripping the membranes, will be done in the hospital. Induction is done in the hospital so that you and the baby can be carefully monitored.  HOW LONG DOES IT TAKE FOR LABOR TO BE INDUCED? Some inductions can take up to 2-3 days. Depending on the cervix, it usually takes less time. It takes longer when you are induced early in the pregnancy or if this is your first pregnancy. If a mother is still pregnant and the induction has been going on for 2-3 days, either the mother will be sent home or a cesarean delivery will be needed. WHAT ARE THE RISKS ASSOCIATED WITH LABOR INDUCTION? Some of the risks of induction include:     Changes in fetal heart rate, such as too high, too low, or erratic.   Fetal distress.   Chance of infection for the mother and baby.   Increased chance of having a cesarean delivery.   Breaking off (abruption) of the placenta from the uterus (rare).   Uterine rupture (very rare).  When induction is needed for medical reasons, the benefits of induction may outweigh the risks. WHAT ARE SOME REASONS FOR NOT INDUCING LABOR? Labor induction should not be done if:   It is shown that your baby does not tolerate labor.   You have had previous surgeries on your  uterus, such as a myomectomy or the removal of fibroids.   Your placenta lies very low in the uterus and blocks the opening of the cervix (placenta previa).   Your baby is not in a head-down position.   The umbilical cord drops down into the birth canal in front of the baby. This could cut off the baby's blood and oxygen supply.   You have had a previous cesarean delivery.   There are unusual circumstances, such as the baby being extremely premature.    This information is not intended to replace advice given to you by your health care provider. Make sure you discuss any questions you have with your health care provider.   Document Released: 06/10/2006 Document Revised: 02/09/2014 Document Reviewed: 08/18/2012 Else[employer] Interactive Patient Education 2016 [employer]  

## 2015-03-11 NOTE — Telephone Encounter (Signed)
Preadmission screen  

## 2015-03-11 NOTE — Progress Notes (Signed)
Subjective:  Melissa Kelly is a 30 y.o. [id number] at [redacted]w[redacted]d being seen today for ongoing prenatal care.  She is currently monitored for the following issues for this high-risk pregnancy and has Supervision of other high risk pregnancies, third trimester; Late prenatal care; Hyperglycemia during pregnancy; History of herpes genitalis; Family history of congenital heart defect; and Homelessness on her problem list.  Patient reports no complaints.  Contractions: Not present. Vag. Bleeding: None.  Movement: Present. Denies leaking of fluid.   The following portions of the patient's history were reviewed and updated as appropriate: allergies, current medications, past family history, past medical history, past social history, past surgical history and problem list. Problem list updated.  Objective:   Filed Vitals:   03/11/15 0942  BP: 129/85  Pulse: 79  Temp: 98.2 F (36.8 C)  Weight: 345 lb 1.6 oz (156.536 kg)    Fetal Status: Fetal Heart Rate (bpm): nst   Movement: Present     General:  Alert, oriented and cooperative. Patient is in no acute distress.  Skin: Skin is warm and dry. No rash noted.   Cardiovascular: Normal heart rate noted  Respiratory: Normal respiratory effort, no problems with respiration noted  Abdomen: Soft, gravid, appropriate for gestational age. Pain/Pressure: Present     Pelvic: Vag. Bleeding: None     Cervical exam performed Dilation: 3 Effacement (%): 70 Station: -3  Extremities: Normal range of motion.  Edema: None  Mental Status: Normal mood and affect. Normal behavior. Normal judgment and thought content.   Urinalysis:      Assessment and Plan:  Pregnancy: [id number] at [redacted]w[redacted]d  1. Supervision of other high risk pregnancies, third trimester FHT, FH normal.  GBS today.  Schedule induction with pitocin  2. Late prenatal care, third trimester   3. History of herpes genitalis Start acyclovir  Term labor symptoms and general obstetric precautions including  but not limited to vaginal bleeding, contractions, leaking of fluid and fetal movement were reviewed in detail with the patient. Please refer to After Visit Summary for other counseling recommendations.  No Follow-up on file.   Melissa Heritage, DO

## 2015-03-11 NOTE — Progress Notes (Signed)
Breastfeeding tip of the week reviewed Need gbs culture

## 2015-03-12 LAB — CULTURE, BETA STREP (GROUP B ONLY)

## 2015-03-13 ENCOUNTER — Other Ambulatory Visit: Payer: Self-pay | Admitting: Advanced Practice Midwife

## 2015-03-14 ENCOUNTER — Inpatient Hospital Stay (HOSPITAL_COMMUNITY)
Admission: RE | Admit: 2015-03-14 | Discharge: 2015-03-17 | DRG: 774 | Disposition: A | Discharge status: Home / Self Care | Payer: Medicaid Other | Source: Ambulatory Visit | Attending: Obstetrics & Gynecology | Admitting: Obstetrics & Gynecology

## 2015-03-14 ENCOUNTER — Encounter (HOSPITAL_COMMUNITY): Payer: Self-pay

## 2015-03-14 VITALS — BP 145/62 | HR 80 | Temp 98.1°F | Resp 18 | Ht 68.0 in | Wt 345.0 lb

## 2015-03-14 DIAGNOSIS — A6000 Herpesviral infection of urogenital system, unspecified: Secondary | ICD-10-CM | POA: Diagnosis present

## 2015-03-14 DIAGNOSIS — O480 Post-term pregnancy: Secondary | ICD-10-CM | POA: Diagnosis present

## 2015-03-14 DIAGNOSIS — Z3A41 41 weeks gestation of pregnancy: Secondary | ICD-10-CM | POA: Diagnosis not present

## 2015-03-14 DIAGNOSIS — Z6841 Body Mass Index (BMI) 40.0 and over, adult: Secondary | ICD-10-CM

## 2015-03-14 DIAGNOSIS — Z823 Family history of stroke: Secondary | ICD-10-CM | POA: Diagnosis not present

## 2015-03-14 DIAGNOSIS — O9832 Other infections with a predominantly sexual mode of transmission complicating childbirth: Secondary | ICD-10-CM | POA: Diagnosis present

## 2015-03-14 DIAGNOSIS — O99824 Streptococcus B carrier state complicating childbirth: Secondary | ICD-10-CM | POA: Diagnosis present

## 2015-03-14 DIAGNOSIS — O99214 Obesity complicating childbirth: Secondary | ICD-10-CM | POA: Diagnosis present

## 2015-03-14 DIAGNOSIS — O09893 Supervision of other high risk pregnancies, third trimester: Secondary | ICD-10-CM

## 2015-03-14 LAB — TYPE AND SCREEN
ABO/RH(D): O POS
ANTIBODY SCREEN: NEGATIVE

## 2015-03-14 LAB — OB RESULTS CONSOLE GBS: STREP GROUP B AG: POSITIVE

## 2015-03-14 LAB — CBC
HEMATOCRIT: 30.6 % — AB (ref 36.0–46.0)
HEMOGLOBIN: 10.1 g/dL — AB (ref 12.0–15.0)
MCH: 25.2 pg — AB (ref 26.0–34.0)
MCHC: 33 g/dL (ref 30.0–36.0)
MCV: 76.3 fL — ABNORMAL LOW (ref 78.0–100.0)
Platelets: 166 10*3/uL (ref 150–400)
RBC: 4.01 MIL/uL (ref 3.87–5.11)
RDW: 14.5 % (ref 11.5–15.5)
WBC: 6 10*3/uL (ref 4.0–10.5)

## 2015-03-14 LAB — ABO/RH: ABO/RH(D): O POS

## 2015-03-14 MED ORDER — ONDANSETRON HCL 4 MG/2ML IJ SOLN: 4.0000 mg | Freq: Four times a day (QID) | INTRAMUSCULAR | Status: DC | PRN

## 2015-03-14 MED ORDER — OXYTOCIN 10 UNIT/ML IJ SOLN
1.0000 m[IU]/min | INTRAVENOUS | Status: DC
  Administered 2015-03-14: 2 m[IU]/min via INTRAVENOUS
  Filled 2015-03-14: qty 4

## 2015-03-14 MED ORDER — OXYTOCIN 10 UNIT/ML IJ SOLN: 2.5000 [IU]/h | INTRAVENOUS | Status: DC

## 2015-03-14 MED ORDER — LACTATED RINGERS IV SOLN
INTRAVENOUS | Status: DC
  Administered 2015-03-14 (×2): via INTRAVENOUS

## 2015-03-14 MED ORDER — OXYTOCIN BOLUS FROM INFUSION: 500.0000 mL | INTRAVENOUS | Status: DC

## 2015-03-14 MED ORDER — OXYCODONE-ACETAMINOPHEN 5-325 MG PO TABS: 2.0000 | ORAL_TABLET | ORAL | Status: DC | PRN

## 2015-03-14 MED ORDER — OXYCODONE-ACETAMINOPHEN 5-325 MG PO TABS: 1.0000 | ORAL_TABLET | ORAL | Status: DC | PRN

## 2015-03-14 MED ORDER — ZOLPIDEM TARTRATE 5 MG PO TABS: 5.0000 mg | ORAL_TABLET | Freq: Every evening | ORAL | Status: DC | PRN

## 2015-03-14 MED ORDER — TERBUTALINE SULFATE 1 MG/ML IJ SOLN
0.2500 mg | Freq: Once | INTRAMUSCULAR | Status: DC | PRN
  Filled 2015-03-14: qty 1

## 2015-03-14 MED ORDER — CITRIC ACID-SODIUM CITRATE 334-500 MG/5ML PO SOLN: 30.0000 mL | ORAL | Status: DC | PRN

## 2015-03-14 MED ORDER — PENICILLIN G POTASSIUM 5000000 UNITS IJ SOLR
2.5000 10*6.[IU] | INTRAVENOUS | Status: DC
  Administered 2015-03-14 – 2015-03-15 (×4): 2.5 10*6.[IU] via INTRAVENOUS
  Filled 2015-03-14 (×8): qty 2.5

## 2015-03-14 MED ORDER — LIDOCAINE HCL (PF) 1 % IJ SOLN
30.0000 mL | INTRAMUSCULAR | Status: DC | PRN
  Filled 2015-03-14: qty 30

## 2015-03-14 MED ORDER — ACETAMINOPHEN 325 MG PO TABS: 650.0000 mg | ORAL_TABLET | ORAL | Status: DC | PRN

## 2015-03-14 MED ORDER — PENICILLIN G POTASSIUM 5000000 UNITS IJ SOLR
5.0000 10*6.[IU] | Freq: Once | INTRAVENOUS | Status: AC
  Administered 2015-03-14: 5 10*6.[IU] via INTRAVENOUS
  Filled 2015-03-14: qty 5

## 2015-03-14 MED ORDER — LACTATED RINGERS IV SOLN: 500.0000 mL | INTRAVENOUS | Status: DC | PRN

## 2015-03-14 MED ORDER — FENTANYL CITRATE (PF) 100 MCG/2ML IJ SOLN: 100.0000 ug | INTRAMUSCULAR | Status: DC | PRN

## 2015-03-14 NOTE — H&P (Signed)
LABOR ADMISSION HISTORY AND PHYSICAL  Melissa Kelly is a 30 y.o. female [phone] with IUP at [redacted]w[redacted]d by LMP presenting for IOL for postdates. She reports +FM, no contractions, No LOF, no VB, no blurry vision, headaches or peripheral edema, and RUQ pain.  She plans on breast and bottle  feeding. She request interval BTL for birth control.  Dating: By LMP --->  Estimated Date of Delivery: 03/03/15  Sono:    @, [redacted]w[redacted]d,limited views of LVOT, spine, cord insertion due to late GA; no gross anomalies noted, cephalic, 2363g, 45% EFW   Prenatal History/Complications: Late PNC Hx of herpes genitalis: last outbreak was 1.5 years ago. Has not been taking Valtrex. Denies burning/tingling sensation on perineal area or fevers.  Hyperglycemia during pregnancy (random CMP with elevated glucose), however patient passed 1 hr GTT (third trimester) Hx of congenital heart defect: patient reports of child with "hold in heart" that closed spontaneously GBS positive  No complications with previous pregnancies other than what is noted above.  Homeless   Past Medical History: Past Medical History  Diagnosis Date  . Medical history non-contributory   . Herpes     Past Surgical History: Past Surgical History  Procedure Laterality Date  . No past surgeries      Obstetrical History: OB History    Gravida Para Term Preterm AB TAB SAB Ectopic Multiple Living   0 1 0 1 0 0 5      Social History: Social History   Social History  . Marital Status: Single    Spouse Name: N/A  . Number of Children: N/A  . Years of Education: N/A   Social History Main Topics  . Smoking status: Never Smoker   . Smokeless tobacco: Never Used  . Alcohol Use: No  . Drug Use: No  . Sexual Activity: Not Currently   Other Topics Concern  . None   Social History Narrative    Family History: Family History  Problem Relation Age of Onset  . Tuberculosis Mother   . Stroke Father     Allergies: No Known  Allergies  Prescriptions prior to admission  Medication Sig Dispense Refill Last Dose  . acetaminophen (TYLENOL) 325 MG tablet Take 650 mg by mouth every 6 (six) hours as needed for mild pain or headache.   Taking  . acyclovir (ZOVIRAX) 200 MG capsule Take 2 capsules (400 mg total) by mouth 2 (two) times daily. (Patient not taking: Reported on 03/08/2015) 60 capsule 6 Not Taking     Review of Systems   All systems reviewed and negative except as stated in HPI  BP 111/62 mmHg  Pulse 95  Temp(Src) 98 F (36.7 C) (Oral)  Ht  (1.727 m)  Wt 156.491 kg (345 lb)  BMI 52.47 kg/m2  LMP 07/01/2014 General appearance: alert and cooperative Lungs: clear to auscultation bilaterally Heart: regular rate and rhythm Abdomen: soft, non-tender; bowel sounds normal Extremities: Homans sign is negative, no sign of DVT, edema GU: no lesions noted on perineum or vaginal canal with speculum exam  Fetal monitoringBaseline: 125-130 bpm, Variability: Good {> 6 bpm), Accelerations: Reactive and Decelerations: Variable: mild x1 Uterine activity: none    Prenatal labs: ABO, Rh: --/--/O POS, O POS (11/29 1433) Antibody: NEG (11/29 1433) Rubella: Immune  RPR: Non Reactive (11/29 1727)  HBsAg: Negative (11/29 1727)  HIV:   neg GBS: Positive (02/09 0000)  1 hr Glucola 101 Genetic screening  Too late Anatomy US normal but limited views  of heart and spine   Prenatal Transfer Tool  Maternal Diabetes: No Genetic Screening: too late Maternal Ultrasounds/Referrals: Normal; limited views  Fetal Ultrasounds or other Referrals:  None Maternal Substance Abuse:  No Significant Maternal Medications:  None Significant Maternal Lab Results: Lab values include: Group B Strep positive  Results for orders placed or performed during the hospital encounter of 03/14/15 (from the past 24 hour(s))  OB RESULT CONSOLE Group B Strep   Collection Time: 03/14/15 12:00 AM  Result Value Ref Range   GBS Positive      Patient Active Problem List   Diagnosis Date Noted  . Post term pregnancy, 41 weeks 03/14/2015  . Homelessness 01/23/2015  . Family history of congenital heart defect 01/17/2015  . Supervision of other high risk pregnancies, third trimester 01/16/2015  . Late prenatal care 01/16/2015  . Hyperglycemia during pregnancy 01/16/2015  . History of herpes genitalis 01/16/2015    Assessment: Melissa Kelly is a 30 y.o. [id number] at [redacted]w[redacted]d here for IOL for PD. Has history of genital herpes; she has not been taking acyclovir.   #Labor: IOL with Pit #Pain: Patient desires labor without pain medications #FWB: Cat 2 ; one variable decel #ID: GBS pos: PCN; Hx of genital herpes- no lesions noted on exam #MOF: breast and bottle #MOC:Interval BTL #Circ: no circ  SW consult PP: homeless   Palma Holter, MD PGY 1 Family Medicine      CNM attestation:  I have seen and examined this patient; I agree with above documentation in the Residents's note.    Clemmons,Lori Alpha, CNM 4:22 PM

## 2015-03-14 NOTE — Consults (Signed)
  Anesthesia Pain Consult Note  Patient: [name], 30 y.o., female  Consult Requested by: Tereso Newcomer, MD  Reason for Consult: Pain Management Rounds  Level of Consciousness: alert  Pain: 0 /10 , Pain GOAL: 8/10  Last Vitals:  Filed Vitals:   03/14/15 0807 03/14/15 0808  BP:  111/62  Pulse:  95  Temp: 36.7 C     Plan: Natural child birth, if possible.     Shatha Hooser L 03/14/2015

## 2015-03-14 NOTE — Progress Notes (Signed)
Labor Progress Note Melissa Kelly is a 31 y.o. [id number] at [redacted]w[redacted]d presented for IOL for PD. Has history of genital herpes; she has not been taking acyclovir.  S: Pt starting to feeling contractions  O:  BP 141/87 mmHg  Pulse 71  Temp(Src) 98.6 F (37 C) (Oral)  Resp 20  Ht  (1.727 m)  Wt 156.491 kg (345 lb)  BMI 52.47 kg/m2  LMP 07/01/2014 EFM: 120s/mild var/+accel/no decel  CVE: Dilation: 4 Effacement (%): 80 Station: -2 Presentation: Vertex Exam by:: [name]   A&P: 30 y.o. [id number] [redacted]w[redacted]d IOL for PD. Has history of genital herpes; she has not been taking acyclovir.  #Labor: IOL with Pit @ 18, will titrate prn; stripped membranes during exam #Pain: prn meds #FWB: cat 2 #GBS: pos - PCN  Wynne Dust, MD 9:29 PM

## 2015-03-15 ENCOUNTER — Inpatient Hospital Stay (HOSPITAL_COMMUNITY): Payer: Medicaid Other | Admitting: Anesthesiology

## 2015-03-15 ENCOUNTER — Encounter (HOSPITAL_COMMUNITY): Payer: Self-pay

## 2015-03-15 DIAGNOSIS — Z3A41 41 weeks gestation of pregnancy: Secondary | ICD-10-CM

## 2015-03-15 DIAGNOSIS — O9832 Other infections with a predominantly sexual mode of transmission complicating childbirth: Secondary | ICD-10-CM

## 2015-03-15 DIAGNOSIS — O99824 Streptococcus B carrier state complicating childbirth: Secondary | ICD-10-CM

## 2015-03-15 DIAGNOSIS — O480 Post-term pregnancy: Secondary | ICD-10-CM

## 2015-03-15 DIAGNOSIS — A6009 Herpesviral infection of other urogenital tract: Secondary | ICD-10-CM

## 2015-03-15 LAB — RPR: RPR Ser Ql: NONREACTIVE

## 2015-03-15 MED ORDER — METHYLERGONOVINE MALEATE 0.2 MG/ML IJ SOLN: 0.2000 mg | INTRAMUSCULAR | Status: DC | PRN

## 2015-03-15 MED ORDER — SIMETHICONE 80 MG PO CHEW: 80.0000 mg | CHEWABLE_TABLET | ORAL | Status: DC | PRN

## 2015-03-15 MED ORDER — WITCH HAZEL-GLYCERIN EX PADS: 1.0000 "application " | MEDICATED_PAD | CUTANEOUS | Status: DC | PRN

## 2015-03-15 MED ORDER — TETANUS-DIPHTH-ACELL PERTUSSIS 5-2.5-18.5 LF-MCG/0.5 IM SUSP: 0.5000 mL | Freq: Once | INTRAMUSCULAR | Status: DC

## 2015-03-15 MED ORDER — FENTANYL 2.5 MCG/ML BUPIVACAINE 1/10 % EPIDURAL INFUSION (WH - ANES)
INTRAMUSCULAR | Status: AC
  Filled 2015-03-15: qty 125

## 2015-03-15 MED ORDER — PRENATAL MULTIVITAMIN CH
1.0000 | ORAL_TABLET | Freq: Every day | ORAL | Status: DC
  Administered 2015-03-15 – 2015-03-17 (×3): 1 via ORAL
  Filled 2015-03-15 (×3): qty 1

## 2015-03-15 MED ORDER — FERROUS SULFATE 325 (65 FE) MG PO TABS
325.0000 mg | ORAL_TABLET | Freq: Two times a day (BID) | ORAL | Status: DC
  Administered 2015-03-15 – 2015-03-17 (×5): 325 mg via ORAL
  Filled 2015-03-15 (×5): qty 1

## 2015-03-15 MED ORDER — IBUPROFEN 600 MG PO TABS
600.0000 mg | ORAL_TABLET | Freq: Four times a day (QID) | ORAL | Status: DC
Start: 2015-03-15 — End: 2015-03-17
  Administered 2015-03-15 – 2015-03-17 (×9): 600 mg via ORAL
  Filled 2015-03-15 (×9): qty 1

## 2015-03-15 MED ORDER — BISACODYL 10 MG RE SUPP: 10.0000 mg | Freq: Every day | RECTAL | Status: DC | PRN

## 2015-03-15 MED ORDER — METHYLERGONOVINE MALEATE 0.2 MG PO TABS: 0.2000 mg | ORAL_TABLET | ORAL | Status: DC | PRN

## 2015-03-15 MED ORDER — PHENYLEPHRINE 40 MCG/ML (10ML) SYRINGE FOR IV PUSH (FOR BLOOD PRESSURE SUPPORT)
PREFILLED_SYRINGE | INTRAVENOUS | Status: AC
  Filled 2015-03-15: qty 20

## 2015-03-15 MED ORDER — ONDANSETRON HCL 4 MG/2ML IJ SOLN: 4.0000 mg | INTRAMUSCULAR | Status: DC | PRN

## 2015-03-15 MED ORDER — DIPHENHYDRAMINE HCL 50 MG/ML IJ SOLN: 12.5000 mg | INTRAMUSCULAR | Status: DC | PRN

## 2015-03-15 MED ORDER — MEASLES, MUMPS & RUBELLA VAC ~~LOC~~ INJ
0.5000 mL | INJECTION | Freq: Once | SUBCUTANEOUS | Status: DC
  Filled 2015-03-15: qty 0.5

## 2015-03-15 MED ORDER — ZOLPIDEM TARTRATE 5 MG PO TABS: 5.0000 mg | ORAL_TABLET | Freq: Every evening | ORAL | Status: DC | PRN

## 2015-03-15 MED ORDER — ACETAMINOPHEN 325 MG PO TABS
650.0000 mg | ORAL_TABLET | ORAL | Status: DC | PRN
  Administered 2015-03-15: 650 mg via ORAL
  Filled 2015-03-15: qty 2

## 2015-03-15 MED ORDER — FENTANYL 2.5 MCG/ML BUPIVACAINE 1/10 % EPIDURAL INFUSION (WH - ANES): 14.0000 mL/h | INTRAMUSCULAR | Status: DC | PRN

## 2015-03-15 MED ORDER — LANOLIN HYDROUS EX OINT: TOPICAL_OINTMENT | CUTANEOUS | Status: DC | PRN

## 2015-03-15 MED ORDER — EPHEDRINE 5 MG/ML INJ
10.0000 mg | INTRAVENOUS | Status: DC | PRN
  Filled 2015-03-15: qty 2

## 2015-03-15 MED ORDER — DIPHENHYDRAMINE HCL 25 MG PO CAPS: 25.0000 mg | ORAL_CAPSULE | Freq: Four times a day (QID) | ORAL | Status: DC | PRN

## 2015-03-15 MED ORDER — FLEET ENEMA 7-19 GM/118ML RE ENEM: 1.0000 | ENEMA | Freq: Every day | RECTAL | Status: DC | PRN

## 2015-03-15 MED ORDER — ONDANSETRON HCL 4 MG PO TABS: 4.0000 mg | ORAL_TABLET | ORAL | Status: DC | PRN

## 2015-03-15 MED ORDER — DIBUCAINE 1 % RE OINT: 1.0000 "application " | TOPICAL_OINTMENT | RECTAL | Status: DC | PRN

## 2015-03-15 MED ORDER — LIDOCAINE HCL (PF) 1 % IJ SOLN
INTRAMUSCULAR | Status: DC | PRN
  Administered 2015-03-15: 8 mL
  Administered 2015-03-15: 8 mL via EPIDURAL

## 2015-03-15 MED ORDER — SENNOSIDES-DOCUSATE SODIUM 8.6-50 MG PO TABS
2.0000 | ORAL_TABLET | ORAL | Status: DC
  Administered 2015-03-15: 2 via ORAL
  Filled 2015-03-15: qty 2

## 2015-03-15 MED ORDER — PHENYLEPHRINE 40 MCG/ML (10ML) SYRINGE FOR IV PUSH (FOR BLOOD PRESSURE SUPPORT)
80.0000 ug | PREFILLED_SYRINGE | INTRAVENOUS | Status: DC | PRN
  Filled 2015-03-15: qty 2

## 2015-03-15 MED ORDER — BENZOCAINE-MENTHOL 20-0.5 % EX AERO: 1.0000 "application " | INHALATION_SPRAY | CUTANEOUS | Status: DC | PRN

## 2015-03-15 NOTE — Anesthesia Procedure Notes (Signed)
Epidural Patient location during procedure: OB Start time: 03/15/2015 3:02 AM End time: 03/15/2015 3:06 AM  Staffing Anesthesiologist: Leilani Able Performed by: anesthesiologist   Preanesthetic Checklist Completed: patient identified, surgical consent, pre-op evaluation, timeout performed, IV checked, risks and benefits discussed and monitors and equipment checked  Epidural Patient position: sitting Prep: site prepped and draped and DuraPrep Patient monitoring: continuous pulse ox and blood pressure Approach: midline Location: L3-L4 Injection technique: LOR air  Needle:  Needle type: Tuohy  Needle gauge: 17 G Needle length: 9 cm and 9 Needle insertion depth: 9 cm Catheter type: closed end flexible Catheter size: 19 Gauge Catheter at skin depth: 15 cm Test dose: negative and Other  Assessment Sensory level: T9 Events: blood not aspirated, injection not painful, no injection resistance, negative IV test and no paresthesia  Additional Notes Reason for block:procedure for pain

## 2015-03-15 NOTE — Anesthesia Preprocedure Evaluation (Signed)
Anesthesia Evaluation  Patient identified by MRN, date of birth, ID band Patient awake    Reviewed: Allergy & Precautions, H&P , NPO status , Patient's Chart, lab work & pertinent test results  Airway Mallampati: II  TM Distance: >3 FB Neck ROM: full    Dental no notable dental hx.    Pulmonary neg pulmonary ROS,    Pulmonary exam normal        Cardiovascular negative cardio ROS Normal cardiovascular exam     Neuro/Psych negative neurological ROS  negative psych ROS   GI/Hepatic negative GI ROS, Neg liver ROS,   Endo/Other  Morbid obesity  Renal/GU negative Renal ROS     Musculoskeletal   Abdominal (+) + obese,   Peds  Hematology negative hematology ROS (+)   Anesthesia Other Findings   Reproductive/Obstetrics (+) Pregnancy                             Anesthesia Physical Anesthesia Plan  ASA: III  Anesthesia Plan: Epidural   Post-op Pain Management:    Induction:   Airway Management Planned:   Additional Equipment:   Intra-op Plan:   Post-operative Plan:   Informed Consent: I have reviewed the patients History and Physical, chart, labs and discussed the procedure including the risks, benefits and alternatives for the proposed anesthesia with the patient or authorized representative who has indicated his/her understanding and acceptance.     Plan Discussed with:   Anesthesia Plan Comments:         Anesthesia Quick Evaluation  

## 2015-03-15 NOTE — Lactation Note (Signed)
This note was copied from a baby's chart. Lactation Consultation Note  P6, Baby 10 hours old and sleeping STS on mother's chest. She states she knows how to hand express and has viewed colostrum. Mother states baby recently bf for 30 min. Denies problems or questions. Discussed cluster feeding and deep latch. Mom encouraged to feed baby 8-12 times/24 hours and with feeding cues.  Mom made aware of O/P services, breastfeeding support groups, community resources, and our phone # for post-discharge questions.    Patient Name: Melissa Kelly [id number] Date: 03/15/2015 Reason for consult: Initial assessment   Maternal Data Has patient been taught Hand Expression?: Yes Does the patient have breastfeeding experience prior to this delivery?: Yes  Feeding Feeding Type: Breast Fed Length of feed: 30 min  LATCH Score/Interventions                      Lactation Tools Discussed/Used     Consult Status Consult Status: PRN    Hardie Pulley 03/15/2015, 1:57 PM

## 2015-03-15 NOTE — Progress Notes (Signed)
Labor Progress Note Melissa Kelly is a 30 y.o. [id number] at [redacted]w[redacted]d presented for IOL for PD. Has history of genital herpes; she has not been taking acyclovir.  S: Pt crying because she is tired.  She is feeling contractions.  Performed AROM  O:  BP 149/77 mmHg  Pulse 72  Temp(Src) 98.6 F (37 C) (Oral)  Resp 20  Ht  (1.727 m)  Wt 156.491 kg (345 lb)  BMI 52.47 kg/m2  LMP 07/01/2014 EFM: 130s/minimal var/+accel/no decel  CVE: Dilation: 5 Effacement (%): 80 Cervical Position: Middle Station: -2 Presentation: Vertex Exam by:: C.Okoroji RN-BSN   A&P: 30 y.o. [id number] [redacted]w[redacted]d IOL for PD. Has history of genital herpes; she has not been taking acyclovir.  #Labor: IOL with Pit, will titrate prn; s/p AROM #Pain: prn meds #FWB: cat 2 #GBS: pos - PCN  Wynne Dust, MD 1:46 AM

## 2015-03-15 NOTE — Anesthesia Postprocedure Evaluation (Signed)
Anesthesia Post Note  Patient: Pensions consultant  Procedure(s) Performed: * No procedures listed *  Patient location during evaluation: Mother Baby Anesthesia Type: Epidural Level of consciousness: awake and alert Pain management: pain level controlled Vital Signs Assessment: post-procedure vital signs reviewed and stable Respiratory status: spontaneous breathing, nonlabored ventilation and respiratory function stable Cardiovascular status: stable Postop Assessment: no headache, no backache and epidural receding Anesthetic complications: no Comments: Pt stated she received no relief due to receiving her epidural too close to delivery. Pain score currently 1.    Last Vitals:  Filed Vitals:   03/15/15 0546 03/15/15 0647  BP: 134/63 136/83  Pulse: 90 77  Temp: 36.6 C 36.8 C  Resp: 18 18    Last Pain:  Filed Vitals:   03/15/15 0824  PainSc: 4                  Dimitra Woodstock

## 2015-03-16 NOTE — Lactation Note (Signed)
This note was copied from a baby's chart. Lactation Consultation Note  Patient Name: Melissa Kelly Date: 03/16/2015 Reason for consult: Follow-up assessment Baby at 37 hr of life and mom is reporting sore nipples. The L nipple does have a small sore on the tip, R appears normal. Given gel pads and reviewed nipple care. Mom stated that baby is biting when he latching but after a few minutes he stops biting and has a comfortable latch. Baby was sleeping and mom stated that she would call for latch help if baby continues to hurt. She reports having other small children at home so she wanted to start formula at birth but since baby does not like the bottle she will wait a month to offer formula again. She is aware of OP services and support group.   Maternal Data    Feeding Feeding Type: Breast Fed Length of feed: 30 min  LATCH Score/Interventions                      Lactation Tools Discussed/Used     Consult Status Consult Status: Follow-up Date: 03/17/15 Follow-up type: In-patient    Rulon Eisenmenger 03/16/2015, 4:41 PM
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
## 2015-03-16 NOTE — Progress Notes (Signed)
Called Midwife regarding BP 145/93 that was taken by Student RN and Instructor.  Recheck was 130/85, no new orders received at this time.  Will continue to monitor.  Vivi Martens RN

## 2015-03-16 NOTE — Progress Notes (Signed)
Post Partum Day #1 Subjective: no complaints, up ad lib and tolerating PO; breastfeeding; for BTL 04/12/15  Objective: Blood pressure 135/78, pulse 74, temperature 98.2 F (36.8 C), temperature source Oral, resp. rate 18, height  (1.727 m), weight 156.491 kg (345 lb), last menstrual period 07/01/2014, SpO2 99 %, unknown if currently breastfeeding.  Physical Exam:  General: alert, cooperative and no distress Lochia: appropriate Uterine Fundus: firm DVT Evaluation: No evidence of DVT seen on physical exam.   Recent Labs  03/14/15 0835  HGB 10.1*  HCT 30.6*    Assessment/Plan: Plan for discharge tomorrow  Social work consult ordered    LOS: 2 days   Cam Hai CNM 03/16/2015, 9:06 AM

## 2015-03-17 MED ORDER — ACETAMINOPHEN 325 MG PO TABS: 650.0000 mg | ORAL_TABLET | Freq: Four times a day (QID) | ORAL | Status: DC | PRN

## 2015-03-17 MED ORDER — IBUPROFEN 600 MG PO TABS: 600.0000 mg | ORAL_TABLET | Freq: Four times a day (QID) | ORAL | Status: DC

## 2015-03-17 MED ORDER — SENNOSIDES-DOCUSATE SODIUM 8.6-50 MG PO TABS: 1.0000 | ORAL_TABLET | Freq: Every evening | ORAL | Status: DC | PRN

## 2015-03-17 NOTE — Discharge Instructions (Signed)
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
## 2015-03-17 NOTE — Progress Notes (Signed)
Discharge instructions reviewed with pt. Pt states she understands when to follow up with MD and when to seek medical attention for herself and baby. Rx reviewed and given. Pt walked to exit with NT

## 2015-03-19 ENCOUNTER — Encounter: Payer: Self-pay | Admitting: *Deleted

## 2015-03-19 ENCOUNTER — Encounter (HOSPITAL_COMMUNITY): Payer: Self-pay | Admitting: *Deleted

## 2015-03-28 ENCOUNTER — Ambulatory Visit: Payer: Medicaid Other | Admitting: Obstetrics & Gynecology

## 2015-04-02 ENCOUNTER — Encounter (HOSPITAL_COMMUNITY): Payer: Self-pay | Admitting: *Deleted

## 2015-04-23 ENCOUNTER — Ambulatory Visit (HOSPITAL_COMMUNITY)
Admission: RE | Admit: 2015-04-23 | Payer: Medicaid Other | Source: Ambulatory Visit | Admitting: Obstetrics & Gynecology

## 2015-04-23 ENCOUNTER — Encounter (HOSPITAL_COMMUNITY): Payer: Self-pay | Admitting: Obstetrics & Gynecology

## 2015-04-23 SURGERY — LIGATION, FALLOPIAN TUBE, LAPAROSCOPIC
Anesthesia: General

## 2015-04-26 ENCOUNTER — Ambulatory Visit: Payer: Medicaid Other | Admitting: Obstetrics & Gynecology

## 2017-02-02 NOTE — L&D Delivery Note (Addendum)
Patient is 32 y.o. [id number] [redacted]w[redacted]d admitted IOL for post dates  Induction: Buccal Cytotec x2 SROM  Delivery Note At  a viable female was delivered via SVD (Presentation: cephalic;  LOA).  APGAR: , 9; weight  .   Placenta status: spontaneous, intact.  Cord: 3VC  Anesthesia:  epidural Episiotomy:  none Lacerations:  none Suture Repair: none Est. Blood Loss (mL):  50 mL  Mom to postpartum.  Baby to Couplet care / Skin to Skin.  Upon arrival patient was complete and pushing. She pushed with good maternal effort to deliver a healthy baby girl. Baby delivered without difficulty, was noted to have good tone and place on maternal abdomen for oral suctioning, drying and stimulation. Delayed cord clamping performed. Placenta delivered intact with 3V cord. Vaginal canal and perineum was inspected and found to be hemostatic and without laceration. Pitocin was started and uterus massaged until bleeding slowed. Counts of sharps, instruments, and lap pads were all correct.   Mirian Mo, MD PGY-1 9/30/20195:25 PM  I confirm that I have verified the information documented in the resident's note and that I have also personally reperformed the physical exam and all medical decision making activities.  I was gloved and present for entire delivery SVD without incident No difficulty with shoulders No lacerations  Aviva Signs, CNM  Please schedule this patient for Postpartum visit in: 1 week with the following provider: Any provider For C/S patients schedule nurse incision check in weeks 2 weeks: no High risk pregnancy complicated by: HTN Delivery mode:  SVD Anticipated Birth Control:  Plans Interval BTL PP Procedures needed: BP check  Schedule Integrated BH visit: no

## 2017-03-03 ENCOUNTER — Ambulatory Visit (INDEPENDENT_AMBULATORY_CARE_PROVIDER_SITE_OTHER): Payer: Medicaid Other

## 2017-03-03 DIAGNOSIS — Z3201 Encounter for pregnancy test, result positive: Secondary | ICD-10-CM | POA: Diagnosis not present

## 2017-03-03 LAB — POCT URINE PREGNANCY: Preg Test, Ur: POSITIVE — AB

## 2017-03-03 MED ORDER — DOXYLAMINE-PYRIDOXINE 10-10 MG PO TBEC: 2.0000 | DELAYED_RELEASE_TABLET | Freq: Every day | ORAL | 5 refills | Status: DC

## 2017-03-03 NOTE — Progress Notes (Signed)
[name] presents today for UPT. She has no unusual complaints. LMP: 01/18/17    OBJECTIVE: Appears well, in no apparent distress.  OB History    Gravida Para Term Preterm AB Living   8 6 6  0 1 6   SAB TAB Ectopic Multiple Live Births   1 0 0 0 6     Home UPT Result: Positive In-Office UPT result: Positive  I have reviewed the patient's medical, obstetrical, social, and family histories, and medications.   ASSESSMENT: Positive pregnancy test  PL[facility] P[facility]'s samples given Pt c/o N&V - Dicglesis sent

## 2017-03-04 ENCOUNTER — Telehealth: Payer: Self-pay | Admitting: Pediatrics

## 2017-03-04 NOTE — Telephone Encounter (Signed)
PA approval # [id number]

## 2017-03-23 ENCOUNTER — Telehealth: Payer: Self-pay

## 2017-03-23 NOTE — Telephone Encounter (Signed)
Pt complains of having bleeding after IC. She states that the the bleeding does stop. Pt informed that this can be normal. Pt advised to monitor bleeding, and if it gets worse accompanied by cramping to be evaluated at [facility]. Pt also advised that she can do some pelvic rest until she comes in for New OB

## 2017-04-05 ENCOUNTER — Encounter: Payer: Medicaid Other | Admitting: Obstetrics and Gynecology

## 2017-04-16 ENCOUNTER — Ambulatory Visit (INDEPENDENT_AMBULATORY_CARE_PROVIDER_SITE_OTHER): Payer: Medicaid Other | Admitting: Certified Nurse Midwife

## 2017-04-16 ENCOUNTER — Encounter: Payer: Self-pay | Admitting: Certified Nurse Midwife

## 2017-04-16 ENCOUNTER — Other Ambulatory Visit (HOSPITAL_COMMUNITY)
Admission: RE | Admit: 2017-04-16 | Discharge: 2017-04-16 | Disposition: A | Discharge status: Home / Self Care | Payer: Medicaid Other | Source: Ambulatory Visit | Attending: Obstetrics and Gynecology | Admitting: Obstetrics and Gynecology

## 2017-04-16 VITALS — BP 134/79 | HR 90 | Wt 371.9 lb

## 2017-04-16 DIAGNOSIS — N930 Postcoital and contact bleeding: Secondary | ICD-10-CM

## 2017-04-16 DIAGNOSIS — Z3480 Encounter for supervision of other normal pregnancy, unspecified trimester: Secondary | ICD-10-CM | POA: Diagnosis not present

## 2017-04-16 DIAGNOSIS — R8271 Bacteriuria: Secondary | ICD-10-CM

## 2017-04-16 DIAGNOSIS — Z8619 Personal history of other infectious and parasitic diseases: Secondary | ICD-10-CM

## 2017-04-16 DIAGNOSIS — Z3481 Encounter for supervision of other normal pregnancy, first trimester: Secondary | ICD-10-CM

## 2017-04-16 DIAGNOSIS — Z3490 Encounter for supervision of normal pregnancy, unspecified, unspecified trimester: Secondary | ICD-10-CM | POA: Insufficient documentation

## 2017-04-16 DIAGNOSIS — O219 Vomiting of pregnancy, unspecified: Secondary | ICD-10-CM

## 2017-04-16 DIAGNOSIS — O09299 Supervision of pregnancy with other poor reproductive or obstetric history, unspecified trimester: Secondary | ICD-10-CM

## 2017-04-16 MED ORDER — ASPIRIN EC 81 MG PO TBEC: 81.0000 mg | DELAYED_RELEASE_TABLET | Freq: Every day | ORAL | 2 refills | Status: DC

## 2017-04-16 MED ORDER — PROMETHAZINE HCL 25 MG PO TABS: 25.0000 mg | ORAL_TABLET | Freq: Four times a day (QID) | ORAL | 3 refills | Status: DC | PRN

## 2017-04-16 NOTE — Progress Notes (Signed)
Pt c/o N & V, no relief with Diclegis. Pt has spotting after IC.

## 2017-04-16 NOTE — Patient Instructions (Addendum)
Pelvic Rest °Pelvic rest may be recommended if: °· Your placenta is partially or completely covering the opening of your cervix (placenta previa). °· There is bleeding between the wall of the uterus and the amniotic sac in the first trimester of pregnancy (subchorionic hemorrhage). °· You went into labor too early (preterm labor). ° °Based on your overall health and the health of your baby, your health care provider will decide if pelvic rest is right for you. °How do I rest my pelvis? °For as long as told by your health care provider: °· Do not have sex, sexual stimulation, or an orgasm. °· Do not use tampons. Do not douche. Do not put anything in your vagina. °· Do not lift anything that is heavier than 10 lb (4.5 kg). °· Avoid activities that take a lot of effort (are strenuous). °· Avoid any activity in which your pelvic muscles could become strained. ° °When should I seek medical care? °Seek medical care if you have: °· Cramping pain in your lower abdomen. °· Vaginal discharge. °· A low, dull backache. °· Regular contractions. °· Uterine tightening. ° °When should I seek immediate medical care? °Seek immediate medical care if: °· You have vaginal bleeding and you are pregnant. ° °This information is not intended to replace advice given to you by your health care provider. Make sure you discuss any questions you have with your health care provider. °Document Released: 05/16/2010 Document Revised: 06/27/2015 Document Reviewed: 07/23/2014 °Elsevier Interactive Patient Education © 2018 Elsevier Inc[employer] ° °First Trimester of Pregnancy °The first trimester of pregnancy is from week 1 until the end of week 13 (months 1 through 3). During this time, your baby will begin to develop inside you. At 6-8 weeks, the eyes and face are formed, and the heartbeat can be seen on ultrasound. At the end of 12 weeks, all the baby's organs are formed. Prenatal care is all the medical care you receive before the birth of your baby. Make  sure you get good prenatal care and follow all of your doctor's instructions. °Follow these instructions at home: °Medicines °· Take over-the-counter and prescription medicines only as told by your doctor. Some medicines are safe and some medicines are not safe during pregnancy. °· Take a prenatal vitamin that contains at least 600 micrograms (mcg) of folic acid. °· If you have trouble pooping (constipation), take medicine that will make your stool soft (stool softener) if your doctor approves. °Eating and drinking °· Eat regular, healthy meals. °· Your doctor will tell you the amount of weight gain that is right for you. °· Avoid raw meat and uncooked cheese. °· If you feel sick to your stomach (nauseous) or throw up (vomit): °? Eat 4 or 5 small meals a day instead of 3 large meals. °? Try eating a few soda crackers. °? Drink liquids between meals instead of during meals. °· To prevent constipation: °? Eat foods that are high in fiber, like fresh fruits and vegetables, whole grains, and beans. °? Drink enough fluids to keep your pee (urine) clear or pale yellow. °Activity °· Exercise only as told by your doctor. Stop exercising if you have cramps or pain in your lower belly (abdomen) or low back. °· Do not exercise if it is too hot, too humid, or if you are in a place of great height (high altitude). °· Try to avoid standing for long periods of time. Move your legs often if you must stand in one place for a long time. °·   Avoid heavy lifting. °· Wear low-heeled shoes. Sit and stand up straight. °· You can have sex unless your doctor tells you not to. °Relieving pain and discomfort °· Wear a good support bra if your breasts are sore. °· Take warm water baths (sitz baths) to soothe pain or discomfort caused by hemorrhoids. Use hemorrhoid cream if your doctor says it is okay. °· Rest with your legs raised if you have leg cramps or low back pain. °· If you have puffy, bulging veins (varicose veins) in your legs: °? Wear  support hose or compression stockings as told by your doctor. °? Raise (elevate) your feet for 15 minutes, 3-4 times a day. °? Limit salt in your food. °Prenatal care °· Schedule your prenatal visits by the twelfth week of pregnancy. °· Write down your questions. Take them to your prenatal visits. °· Keep all your prenatal visits as told by your doctor. This is important. °Safety °· Wear your seat belt at all times when driving. °· Make a list of emergency phone numbers. The list should include numbers for family, friends, the hospital, and police and fire departments. °General instructions °· Ask your doctor for a referral to a local prenatal class. Begin classes no later than at the start of month 6 of your pregnancy. °· Ask for help if you need counseling or if you need help with nutrition. Your doctor can give you advice or tell you where to go for help. °· Do not use hot tubs, steam rooms, or saunas. °· Do not douche or use tampons or scented sanitary pads. °· Do not cross your legs for long periods of time. °· Avoid all herbs and alcohol. Avoid drugs that are not approved by your doctor. °· Do not use any tobacco products, including cigarettes, chewing tobacco, and electronic cigarettes. If you need help quitting, ask your doctor. You may get counseling or other support to help you quit. °· Avoid cat litter boxes and soil used by cats. These carry germs that can cause birth defects in the baby and can cause a loss of your baby (miscarriage) or stillbirth. °· Visit your dentist. At home, brush your teeth with a soft toothbrush. Be gentle when you floss. °Contact a doctor if: °· You are dizzy. °· You have mild cramps or pressure in your lower belly. °· You have a nagging pain in your belly area. °· You continue to feel sick to your stomach, you throw up, or you have watery poop (diarrhea). °· You have a bad smelling fluid coming from your vagina. °· You have pain when you pee (urinate). °· You have increased  puffiness (swelling) in your face, hands, legs, or ankles. °Get help right away if: °· You have a fever. °· You are leaking fluid from your vagina. °· You have spotting or bleeding from your vagina. °· You have very bad belly cramping or pain. °· You gain or lose weight rapidly. °· You throw up blood. It may look like coffee grounds. °· You are around people who have German measles, fifth disease, or chickenpox. °· You have a very bad headache. °· You have shortness of breath. °· You have any kind of trauma, such as from a fall or a car accident. °Summary °· The first trimester of pregnancy is from week 1 until the end of week 13 (months 1 through 3). °· To take care of yourself and your unborn baby, you will need to eat healthy meals, take medicines only if your   doctor tells you to do so, and do activities that are safe for you and your baby. °· Keep all follow-up visits as told by your doctor. This is important as your doctor will have to ensure that your baby is healthy and growing well. °This information is not intended to replace advice given to you by your health care provider. Make sure you discuss any questions you have with your health care provider. °Document Released: 07/08/2007 Document Revised: 01/28/2016 Document Reviewed: 01/28/2016 °Elsevier Interactive Patient Education © 2017 Elsevier Inc. ° °

## 2017-04-16 NOTE — Progress Notes (Signed)
Subjective:   Melissa Kelly is a 32 y.o. [id number] at 52w4dby LMP being seen today for her first obstetrical visit.  Her obstetrical history is significant for obesity. Patient does intend to breast feed and formula feed. This was a planned pregnancy- patient is excited about pregnancy. Pregnancy history fully reviewed.  Patient reports nausea, vomiting and bleeding after intercourse. She reports spotting after intercourse, has continued having intercourse and it has occurred for a total of 4 times. She denies bleeding or abdominal pain at this time.   HISTORY: Obstetric History   G8   P6   T6   P0   A1   L6    SAB1   TAB0   Ectopic0   Multiple0   Live Births6     # Outcome Date GA Lbr Len/2nd Weight Sex Delivery Anes PTL Lv  8 Current           7 Term 03/15/15 447w5d 00:03 7 lb 7.9 oz (3.4 kg) M Vag-Spont EPI  LIV     Name: BAJASEY, CORTEZ   Apgar1:  8                Apgar5: 9  6 Term 2013   7 lb 7 oz (3.374 kg) M Vag-Spont   LIV  5 Term 2012   6 lb 15 oz (3.147 kg) F Vag-Spont   LIV  4 Term 2011   7 lb 6 oz (3.345 kg) M Vag-Spont   LIV  3 Term 2010   6 lb 11 oz (3.033 kg) F Vag-Spont   LIV  2 Term 2007   7 lb (3.175 kg) F Vag-Spont   LIV  1 SAB 2005              Last pap smear was done 2014 and was normal per patient.   Past Medical History:  Diagnosis Date  . Herpes   . Hyperglycemia during pregnancy 01/16/2015   Random CBG 405 on CMP.  One hour GTT 101 Hemoglobin A1c 5.1 Normal abdominal circumference and AFI on anatomy ultrasound   . Late prenatal care 01/16/2015  . Supervision of other high risk pregnancies, third trimester 01/16/2015    [facility]  Dating LMP  Blood type: --/--/O POS, O POS (11/29 1433)   Genetic Screen Too late Antibody:NEG (11/29 1433)  Anatomic USK[place]ormal female Rubella: 4.89 (11/29 1727)  GTT  Third trimester: 101 RPR: Non Reactive (11/29 1727)   Flu vaccine  HBsAg: Negative (11/29 1727)   TDaP vaccine    01/16/15                                         HIV:   Neg  Baby Food     Breast        Past Surgical History:  Procedure Laterality Date  . NO PAST SURGERIES     Family History  Problem Relation Age of Onset  . Tuberculosis Mother   . Stroke Father    Social History   Tobacco Use  . Smoking status: Never Smoker  . Smokeless tobacco: Never Used  Substance Use Topics  . Alcohol use: Yes    Comment: occasional  . Drug use: No   No Known Allergies Current Outpatient Medications on File Prior to Visit  Medication Sig Dispense Refill  . Prenatal  Vit w/Fe-Methylfol-FA (PNV PO) Take by mouth.    Marland Kitchen acetaminophen (TYLENOL) 325 MG tablet Take 2 tablets (650 mg total) by mouth every 6 (six) hours as needed for mild pain or headache. (Patient not taking: Reported on 03/03/2017) 90 tablet 3  . Doxylamine-Pyridoxine (DICLEGIS) 10-10 MG TBEC Take 2 tablets by mouth at bedtime. If symptoms persist, add one tablet in the morning and one in the afternoon (Patient not taking: Reported on 04/16/2017) 100 tablet 5  . ibuprofen (ADVIL,MOTRIN) 600 MG tablet Take 1 tablet (600 mg total) by mouth every 6 (six) hours. (Patient not taking: Reported on 03/03/2017) 90 tablet 3  . senna-docusate (SENOKOT-S) 8.6-50 MG tablet Take 1 tablet by mouth at bedtime as needed for mild constipation. (Patient not taking: Reported on 03/03/2017) 30 tablet 0   No current facility-administered medications on file prior to visit.     Review of Systems Pertinent items noted in HPI and remainder of comprehensive ROS otherwise negative.  Exam   Vitals:   04/16/17 0832  BP: 134/79  Pulse: 90  Weight: (!) 371 lb 14.4 oz (168.7 kg)   Fetal Heart Rate (bpm): 160  Pelvic Exam: Perineum: no hemorrhoids, normal perineum   Vulva: normal external genitalia, no lesions   Vagina:  normal mucosa, normal discharge   Cervix: no lesions and normal, pap smear done.    Adnexa: normal adnexa and no mass, fullness, tenderness   Bony Pelvis:  average  System: General: well-developed, morbid obese female in no acute distress   Breast:  normal appearance, no masses or tenderness   Skin: normal coloration and turgor, no rashes   Neurologic: oriented, normal, negative, normal mood   Extremities: normal strength, tone, and muscle mass, ROM of all joints is normal   HEENT PERRLA, extraocular movement intact and sclera clear.   Mouth/Teeth mucous membranes moist, pharynx normal without lesions and dental hygiene good   Neck supple and no masses   Cardiovascular: regular rate and rhythm   Respiratory:  no respiratory distress, normal breath sounds   Abdomen: soft, non-tender; bowel sounds normal; no masses,  no organomegaly     Assessment:   Pregnancy: [id number] Patient Active Problem List   Diagnosis Date Noted  . Supervision of normal pregnancy, antepartum 04/16/2017  . Morbid obesity ([place]) 04/16/2017  . Hx of preeclampsia, prior pregnancy, currently pregnant 04/16/2017  . Homelessness 01/23/2015  . Family history of congenital heart defect 01/17/2015  . History of herpes genitalis 01/16/2015     Plan:  1. Supervision of other normal pregnancy, antepartum - Culture, OB Urine - Cystic Fibrosis Mutation 97 - Hemoglobinopathy evaluation - Obstetric Panel, Including HIV - VITAMIN D 25 Hydroxy (Vit-D Deficiency, Fractures) - Cytology - PAP - Cervicovaginal ancillary only - Hemoglobin A1c - Enroll Patient in Babyscripts - [place] MFM OB COMP + 14 WK; Future - Genetic Screening  2. Morbid obesity ([place]) -Educated on the recommended weight gain during pregnancy, started on aspirin dosing  - CBC - Comp Met (CMET) - aspirin EC 81 MG tablet; Take 1 tablet (81 mg total) by mouth daily. Take after 12 weeks for prevention of preeclampsia later in pregnancy  Dispense: 300 tablet; Refill: 2  3. Nausea and vomiting during pregnancy prior to [redacted] weeks gestation -Reports N/V 2x daily. Pt currently taking diclegis with no relief of symptoms.  Discussed phenergan during pregnancy.  - promethazine (PHENERGAN) 25 MG tablet; Take 1 tablet (25 mg total) by mouth every 6 (six) hours as needed for nausea  or vomiting.  Dispense: 30 tablet; Refill: 3  4. Bleeding after intercourse -Discussed pelvic rest and no intercourse until [place] is complete to make sure bleeding is not a cause of a possible placenta previa.   5. Hx of preeclampsia, prior pregnancy, currently pregnant -Patient reports preeclampsia being in other pregnancy that was not delivered at [facility].    Initial labs drawn. Continue prenatal vitamins. Genetic Screening discussed, First trimester screen, Quad screen and NIPS: ordered. Ultrasound discussed; fetal anatomic survey: ordered. Problem list reviewed and updated. The nature of [place] with multiple MDs and other Advanced Practice Providers was explained to patient; also emphasized that residents, students are part of our team. Routine obstetric precautions reviewed. Return in about 4 weeks (around 05/14/2017) for ROB/AFP.     Lajean Manes, CNM 04/16/17, 9:18 AM

## 2017-04-17 LAB — VITAMIN D 25 HYDROXY (VIT D DEFICIENCY, FRACTURES): Vit D, 25-Hydroxy: 8.6 ng/mL — ABNORMAL LOW (ref 30.0–100.0)

## 2017-04-19 LAB — CERVICOVAGINAL ANCILLARY ONLY
Bacterial vaginitis: NEGATIVE
Candida vaginitis: NEGATIVE
Chlamydia: NEGATIVE
Neisseria Gonorrhea: NEGATIVE
Trichomonas: NEGATIVE

## 2017-04-20 ENCOUNTER — Other Ambulatory Visit: Payer: Self-pay | Admitting: Certified Nurse Midwife

## 2017-04-20 DIAGNOSIS — E559 Vitamin D deficiency, unspecified: Secondary | ICD-10-CM

## 2017-04-20 LAB — OBSTETRIC PANEL, INCLUDING HIV
Antibody Screen: NEGATIVE
Basophils Absolute: 0 10*3/uL (ref 0.0–0.2)
Basos: 0 %
EOS (ABSOLUTE): 0 10*3/uL (ref 0.0–0.4)
Eos: 0 %
HIV Screen 4th Generation wRfx: NONREACTIVE
Hematocrit: 36.3 % (ref 34.0–46.6)
Hemoglobin: 11.6 g/dL (ref 11.1–15.9)
Hepatitis B Surface Ag: NEGATIVE
Immature Grans (Abs): 0 10*3/uL (ref 0.0–0.1)
Immature Granulocytes: 0 %
Lymphocytes Absolute: 1.2 10*3/uL (ref 0.7–3.1)
Lymphs: 35 %
MCH: 24.9 pg — ABNORMAL LOW (ref 26.6–33.0)
MCHC: 32 g/dL (ref 31.5–35.7)
MCV: 78 fL — ABNORMAL LOW (ref 79–97)
Monocytes Absolute: 0.3 10*3/uL (ref 0.1–0.9)
Monocytes: 9 %
Neutrophils Absolute: 2 10*3/uL (ref 1.4–7.0)
Neutrophils: 56 %
Platelets: 174 10*3/uL (ref 150–379)
RBC: 4.65 x10E6/uL (ref 3.77–5.28)
RDW: 15.7 % — ABNORMAL HIGH (ref 12.3–15.4)
RPR Ser Ql: NONREACTIVE
Rh Factor: POSITIVE
Rubella Antibodies, IGG: 3.83 index (ref >0.99)
WBC: 3.5 10*3/uL (ref 3.4–10.8)

## 2017-04-20 LAB — HEMOGLOBIN A1C
Est. average glucose Bld gHb Est-mCnc: 105 mg/dL
Hgb A1c MFr Bld: 5.3 % (ref 4.8–5.6)

## 2017-04-20 LAB — CULTURE, OB URINE

## 2017-04-20 LAB — COMPREHENSIVE METABOLIC PANEL
ALT: 17 IU/L (ref 0–32)
AST: 17 IU/L (ref 0–40)
Albumin/Globulin Ratio: 1.3 (ref 1.2–2.2)
Albumin: 4 g/dL (ref 3.5–5.5)
Alkaline Phosphatase: 53 IU/L (ref 39–117)
BUN/Creatinine Ratio: 8 — ABNORMAL LOW (ref 9–23)
BUN: 6 mg/dL (ref 6–20)
Bilirubin Total: 0.5 mg/dL (ref 0.0–1.2)
CO2: 22 mmol/L (ref 20–29)
Calcium: 9.3 mg/dL (ref 8.7–10.2)
Chloride: 99 mmol/L (ref 96–106)
Creatinine, Ser: 0.8 mg/dL (ref 0.57–1.00)
GFR calc Af Amer: 112 mL/min/{1.73_m2} (ref >59)
GFR calc non Af Amer: 97 mL/min/{1.73_m2} (ref >59)
Globulin, Total: 3.2 g/dL (ref 1.5–4.5)
Glucose: 88 mg/dL (ref 65–99)
Potassium: 4.2 mmol/L (ref 3.5–5.2)
Sodium: 136 mmol/L (ref 134–144)
Total Protein: 7.2 g/dL (ref 6.0–8.5)

## 2017-04-20 LAB — HEMOGLOBINOPATHY EVALUATION
HGB C: 0 %
HGB S: 0 %
HGB VARIANT: 0 %
Hemoglobin A2 Quantitation: 2.2 % (ref 1.8–3.2)
Hemoglobin F Quantitation: 0 % (ref 0.0–2.0)
Hgb A: 97.8 % (ref 96.4–98.8)

## 2017-04-20 LAB — URINE CULTURE, OB REFLEX

## 2017-04-20 MED ORDER — VITAMIN D3 20 MCG (800 UNIT) PO TABS: 1.0000 | ORAL_TABLET | Freq: Every day | ORAL | 3 refills | Status: AC

## 2017-04-20 NOTE — Progress Notes (Signed)
Pt called about Vitamin D deficiency. Rx sent to pharmacy of choice. Answered patient's questions.

## 2017-04-21 LAB — CYTOLOGY - PAP
Adequacy: ABSENT
Diagnosis: NEGATIVE
HPV: NOT DETECTED

## 2017-04-22 LAB — CYSTIC FIBROSIS MUTATION 97: Interpretation: NOT DETECTED

## 2017-04-26 DIAGNOSIS — R8271 Bacteriuria: Secondary | ICD-10-CM | POA: Insufficient documentation

## 2017-05-13 ENCOUNTER — Ambulatory Visit (INDEPENDENT_AMBULATORY_CARE_PROVIDER_SITE_OTHER): Payer: Medicaid Other | Admitting: Obstetrics and Gynecology

## 2017-05-13 ENCOUNTER — Encounter: Payer: Self-pay | Admitting: Obstetrics and Gynecology

## 2017-05-13 VITALS — BP 119/74 | HR 85 | Wt 370.4 lb

## 2017-05-13 DIAGNOSIS — R8271 Bacteriuria: Secondary | ICD-10-CM

## 2017-05-13 DIAGNOSIS — Z3480 Encounter for supervision of other normal pregnancy, unspecified trimester: Secondary | ICD-10-CM

## 2017-05-13 DIAGNOSIS — O09299 Supervision of pregnancy with other poor reproductive or obstetric history, unspecified trimester: Secondary | ICD-10-CM

## 2017-05-13 DIAGNOSIS — Z8619 Personal history of other infectious and parasitic diseases: Secondary | ICD-10-CM

## 2017-05-13 NOTE — Progress Notes (Signed)
   PRENATAL VISIT NOTE  Subjective:  Delbert HarnessSharlyne Moccio is a 32 y.o. [id number] at 8619w3d being seen today for ongoing prenatal care.  She is currently monitored for the following issues for this low-risk pregnancy and has History of herpes genitalis; Family history of congenital heart defect; Homelessness; Supervision of normal pregnancy, antepartum; Morbid obesity (HCC); Hx of preeclampsia, prior pregnancy, currently pregnant; and Group B streptococcal bacteriuria on their problem list.  Patient reports no complaints.  Contractions: Not present. Vag. Bleeding: None.  Movement: Present. Denies leaking of fluid.   The following portions of the patient's history were reviewed and updated as appropriate: allergies, current medications, past family history, past medical history, past social history, past surgical history and problem list. Problem list updated.  Objective:   Vitals:   05/13/17 0930  BP: 119/74  Pulse: 85  Weight: (!) 370 lb 6.4 oz (168 kg)    Fetal Status: Fetal Heart Rate (bpm): 148   Movement: Present     General:  Alert, oriented and cooperative. Patient is in no acute distress.  Skin: Skin is warm and dry. No rash noted.   Cardiovascular: Normal heart rate noted  Respiratory: Normal respiratory effort, no problems with respiration noted  Abdomen: Soft, gravid, appropriate for gestational age.  Pain/Pressure: Absent     Pelvic: Cervical exam deferred        Extremities: Normal range of motion.  Edema: None  Mental Status: Normal mood and affect. Normal behavior. Normal judgment and thought content.   Assessment and Plan:  Pregnancy: [id number] at 2319w3d  1. Supervision of other normal pregnancy, antepartum Patient is doing well without complaints AFP today Anatomy ultrasound scheduled - AFP, Serum, Open Spina Bifida  2. Hx of preeclampsia, prior pregnancy, currently pregnant Continue ASA  3. History of herpes genitalis Will provide prophylaxis at 34 weeks  4.  Group B streptococcal bacteriuria Prophylaxis in labor  5. Morbid obesity (HCC) Continue ASA  Preterm labor symptoms and general obstetric precautions including but not limited to vaginal bleeding, contractions, leaking of fluid and fetal movement were reviewed in detail with the patient. Please refer to After Visit Summary for other counseling recommendations.  Return in about 1 month (around 06/10/2017) for ROB.  Future Appointments  Date Time Provider Department Center  05/31/2017  8:45 AM [facility] US 2 [facility]    Catalina AntiguaPeggy Griselle Rufer, MD

## 2017-05-13 NOTE — Progress Notes (Signed)
Patient reports fetal movement, denies pain. 

## 2017-05-18 LAB — AFP, SERUM, OPEN SPINA BIFIDA
AFP MOM: 0.71
AFP VALUE AFPOSL: 16.2 ng/mL
Gest. Age on Collection Date: 16.3 weeks
Maternal Age At EDD: 34 yr
OSBR Risk 1 IN: 10000
TEST RESULTS AFP: NEGATIVE
WEIGHT: 370 [lb_av]

## 2017-05-31 ENCOUNTER — Ambulatory Visit (HOSPITAL_COMMUNITY)
Admission: RE | Admit: 2017-05-31 | Discharge: 2017-05-31 | Disposition: A | Discharge status: Home / Self Care | Payer: Medicaid Other | Source: Ambulatory Visit | Attending: Certified Nurse Midwife | Admitting: Certified Nurse Midwife

## 2017-05-31 ENCOUNTER — Other Ambulatory Visit: Payer: Self-pay | Admitting: Certified Nurse Midwife

## 2017-05-31 DIAGNOSIS — Z6841 Body Mass Index (BMI) 40.0 and over, adult: Secondary | ICD-10-CM

## 2017-05-31 DIAGNOSIS — Z363 Encounter for antenatal screening for malformations: Secondary | ICD-10-CM | POA: Insufficient documentation

## 2017-05-31 DIAGNOSIS — O99212 Obesity complicating pregnancy, second trimester: Secondary | ICD-10-CM | POA: Insufficient documentation

## 2017-05-31 DIAGNOSIS — Z3689 Encounter for other specified antenatal screening: Secondary | ICD-10-CM

## 2017-05-31 DIAGNOSIS — O09299 Supervision of pregnancy with other poor reproductive or obstetric history, unspecified trimester: Secondary | ICD-10-CM

## 2017-05-31 DIAGNOSIS — Z3A19 19 weeks gestation of pregnancy: Secondary | ICD-10-CM

## 2017-05-31 DIAGNOSIS — Z3480 Encounter for supervision of other normal pregnancy, unspecified trimester: Secondary | ICD-10-CM

## 2017-05-31 DIAGNOSIS — O09292 Supervision of pregnancy with other poor reproductive or obstetric history, second trimester: Secondary | ICD-10-CM | POA: Diagnosis not present

## 2017-06-10 ENCOUNTER — Encounter: Payer: Medicaid Other | Admitting: Obstetrics and Gynecology

## 2017-06-16 ENCOUNTER — Telehealth: Payer: Self-pay | Admitting: *Deleted

## 2017-06-16 NOTE — Telephone Encounter (Signed)
Attempted to call patient to reschedule missed OB appointment on 06/10/2017... No answer/busysignal

## 2017-06-23 ENCOUNTER — Encounter: Payer: Medicaid Other | Admitting: Obstetrics & Gynecology

## 2017-07-22 ENCOUNTER — Encounter: Payer: Self-pay | Admitting: Certified Nurse Midwife

## 2017-07-22 ENCOUNTER — Ambulatory Visit (INDEPENDENT_AMBULATORY_CARE_PROVIDER_SITE_OTHER): Payer: Medicaid Other | Admitting: Certified Nurse Midwife

## 2017-07-22 VITALS — BP 119/78 | HR 93 | Wt 373.0 lb

## 2017-07-22 DIAGNOSIS — O99712 Diseases of the skin and subcutaneous tissue complicating pregnancy, second trimester: Secondary | ICD-10-CM

## 2017-07-22 DIAGNOSIS — Z3482 Encounter for supervision of other normal pregnancy, second trimester: Secondary | ICD-10-CM

## 2017-07-22 DIAGNOSIS — L282 Other prurigo: Secondary | ICD-10-CM

## 2017-07-22 DIAGNOSIS — Z3480 Encounter for supervision of other normal pregnancy, unspecified trimester: Secondary | ICD-10-CM

## 2017-07-22 NOTE — Progress Notes (Signed)
   PRENATAL VISIT NOTE  Subjective:  Melissa Kelly is a 32 y.o. [id number] at 5745w3d being seen today for ongoing prenatal care.  She is currently monitored for the following issues for this low-risk pregnancy and has History of herpes genitalis; Family history of congenital heart defect; Homelessness; Supervision of normal pregnancy, antepartum; Morbid obesity (HCC); Hx of preeclampsia, prior pregnancy, currently pregnant; and Group B streptococcal bacteriuria on their problem list.  Patient reports itching. She reports itching all over her body that occurs during the day but gets worse at night   Contractions: Irregular. Vag. Bleeding: None.  Movement: Present. Denies leaking of fluid.   The following portions of the patient's history were reviewed and updated as appropriate: allergies, current medications, past family history, past medical history, past social history, past surgical history and problem list. Problem list updated.  Objective:   Vitals:   07/22/17 0900  BP: 119/78  Pulse: 93  Weight: (!) 373 lb (169.2 kg)    Fetal Status: Fetal Heart Rate (bpm): 152 Fundal Height: 29 cm Movement: Present     General:  Alert, oriented and cooperative. Patient is in no acute distress.  Skin: Skin is warm and dry. No rash noted.   Cardiovascular: Normal heart rate noted  Respiratory: Normal respiratory effort, no problems with respiration noted  Abdomen: Soft, gravid, appropriate for gestational age.  Pain/Pressure: Absent     Pelvic: Cervical exam deferred        Extremities: Normal range of motion.  Edema: None  Mental Status: Normal mood and affect. Normal behavior. Normal judgment and thought content.   Assessment and Plan:  Pregnancy: [id number] at 6045w3d  1. Prurigo of pregnancy in second trimester - Patient reports itching that started 2 weeks ago, reports itching occurs during the day but gets worse at night. Has tried lotion and new soaps with no relief. She denies hx of  cholestasis of pregnancy with previous pregnancies. Discussed with patient labs being drawn today and POC if elevated or normal. Patient verbalizes understanding.  - Bile acids, total  2. Supervision of other normal pregnancy, antepartum -Discussed importance of being seen for every appointment and not missing appointments. Patient verbalizes understanding. -Anticipatory guidance on upcoming appointment with GTT and lab work.   3. Morbid obesity (HCC)  Preterm labor symptoms and general obstetric precautions including but not limited to vaginal bleeding, contractions, leaking of fluid and fetal movement were reviewed in detail with the patient. Please refer to After Visit Summary for other counseling recommendations.  Return in about 2 weeks (around 08/05/2017) for ROB/2hrGTT/3Tlabs.  Future Appointments  Date Time Provider Department Center  08/04/2017  8:00 AM [facility] None  08/04/2017  8:30 AM Adam PhenixArnold, James G, MD [facility] None    Sharyon CableVeronica C Blessin Kanno, CNM

## 2017-07-22 NOTE — Patient Instructions (Signed)

## 2017-07-22 NOTE — Progress Notes (Signed)
Pt complains of itching all over her body.

## 2017-07-24 LAB — BILE ACIDS, TOTAL: Bile Acids Total: 8.9 umol/L (ref 4.7–24.5)

## 2017-08-04 ENCOUNTER — Ambulatory Visit (INDEPENDENT_AMBULATORY_CARE_PROVIDER_SITE_OTHER): Payer: Medicaid Other | Admitting: Obstetrics & Gynecology

## 2017-08-04 ENCOUNTER — Other Ambulatory Visit: Payer: Medicaid Other

## 2017-08-04 VITALS — BP 124/89 | HR 85 | Wt 368.0 lb

## 2017-08-04 DIAGNOSIS — Z3480 Encounter for supervision of other normal pregnancy, unspecified trimester: Secondary | ICD-10-CM

## 2017-08-04 NOTE — Progress Notes (Signed)
   PRENATAL VISIT NOTE  Subjective:  Melissa HarnessSharlyne Kelly is a 32 y.o. [id number] at 2423w2d being seen today for ongoing prenatal care.  She is currently monitored for the following issues for this high-risk pregnancy and has History of herpes genitalis; Family history of congenital heart defect; Homelessness; Supervision of normal pregnancy, antepartum; Morbid obesity (HCC); Hx of preeclampsia, prior pregnancy, currently pregnant; and Group B streptococcal bacteriuria on their problem list.  Patient reports fatigue and heartburn.  Contractions: Irritability. Vag. Bleeding: None.  Movement: Present. Denies leaking of fluid.   The following portions of the patient's history were reviewed and updated as appropriate: allergies, current medications, past family history, past medical history, past social history, past surgical history and problem list. Problem list updated.  Objective:   Vitals:   08/04/17 0828  BP: 124/89  Pulse: 85  Weight: (!) 368 lb (166.9 kg)    Fetal Status: Fetal Heart Rate (bpm): 145   Movement: Present     General:  Alert, oriented and cooperative. Patient is in no acute distress.  Skin: Skin is warm and dry. No rash noted.   Cardiovascular: Normal heart rate noted  Respiratory: Normal respiratory effort, no problems with respiration noted  Abdomen: Soft, gravid, appropriate for gestational age.  Pain/Pressure: Absent     Pelvic: Cervical exam deferred        Extremities: Normal range of motion.     Mental Status: Normal mood and affect. Normal behavior. Normal judgment and thought content.   Assessment and Plan:  Pregnancy: [id number] at 1523w2d  1. Supervision of other normal pregnancy, antepartum Growth f/u recommended - US MFM OB FOLLOW UP; Future  Preterm labor symptoms and general obstetric precautions including but not limited to vaginal bleeding, contractions, leaking of fluid and fetal movement were reviewed in detail with the patient. Please refer to After  Visit Summary for other counseling recommendations.  Return in about 2 weeks (around 08/18/2017) for 2 hr GTT.  Future Appointments  Date Time Provider Department Center  08/09/2017  8:15 AM [facility] None    Scheryl DarterJames Terrall Bley, MD

## 2017-08-04 NOTE — Patient Instructions (Signed)

## 2017-08-09 ENCOUNTER — Other Ambulatory Visit: Payer: Medicaid Other

## 2017-08-09 DIAGNOSIS — Z3480 Encounter for supervision of other normal pregnancy, unspecified trimester: Secondary | ICD-10-CM

## 2017-08-10 LAB — CBC
Hematocrit: 34.2 % (ref 34.0–46.6)
Hemoglobin: 10.7 g/dL — ABNORMAL LOW (ref 11.1–15.9)
MCH: 25.5 pg — ABNORMAL LOW (ref 26.6–33.0)
MCHC: 31.3 g/dL — ABNORMAL LOW (ref 31.5–35.7)
MCV: 81 fL (ref 79–97)
PLATELETS: 163 10*3/uL (ref 150–450)
RBC: 4.2 x10E6/uL (ref 3.77–5.28)
RDW: 14.8 % (ref 12.3–15.4)
WBC: 5.9 10*3/uL (ref 3.4–10.8)

## 2017-08-10 LAB — GLUCOSE TOLERANCE, 2 HOURS W/ 1HR
GLUCOSE, 1 HOUR: 132 mg/dL (ref 65–179)
GLUCOSE, 2 HOUR: 74 mg/dL (ref 65–152)
GLUCOSE, FASTING: 73 mg/dL (ref 65–91)

## 2017-08-10 LAB — RPR: RPR: NONREACTIVE

## 2017-08-10 LAB — HIV ANTIBODY (ROUTINE TESTING W REFLEX): HIV SCREEN 4TH GENERATION: NONREACTIVE

## 2017-08-12 ENCOUNTER — Ambulatory Visit (HOSPITAL_COMMUNITY)
Admission: RE | Admit: 2017-08-12 | Discharge: 2017-08-12 | Disposition: A | Discharge status: Home / Self Care | Payer: Medicaid Other | Source: Ambulatory Visit | Attending: Obstetrics & Gynecology | Admitting: Obstetrics & Gynecology

## 2017-08-12 DIAGNOSIS — O09293 Supervision of pregnancy with other poor reproductive or obstetric history, third trimester: Secondary | ICD-10-CM

## 2017-08-12 DIAGNOSIS — O99213 Obesity complicating pregnancy, third trimester: Secondary | ICD-10-CM | POA: Diagnosis not present

## 2017-08-12 DIAGNOSIS — Z3480 Encounter for supervision of other normal pregnancy, unspecified trimester: Secondary | ICD-10-CM

## 2017-08-12 DIAGNOSIS — Z3A29 29 weeks gestation of pregnancy: Secondary | ICD-10-CM | POA: Insufficient documentation

## 2017-08-12 DIAGNOSIS — Z362 Encounter for other antenatal screening follow-up: Secondary | ICD-10-CM | POA: Diagnosis present

## 2017-08-12 DIAGNOSIS — E669 Obesity, unspecified: Secondary | ICD-10-CM | POA: Diagnosis not present

## 2017-08-18 ENCOUNTER — Encounter: Payer: Medicaid Other | Admitting: Obstetrics & Gynecology

## 2017-08-18 NOTE — Progress Notes (Deleted)
   Patient did not show up today for her scheduled appointment.   Jamesen Stahnke, MD, FACOG Obstetrician & Gynecologist, [facility], [facility]  

## 2017-08-31 ENCOUNTER — Other Ambulatory Visit: Payer: Self-pay

## 2017-08-31 ENCOUNTER — Ambulatory Visit (INDEPENDENT_AMBULATORY_CARE_PROVIDER_SITE_OTHER): Payer: Medicaid Other | Admitting: Nurse Practitioner

## 2017-08-31 ENCOUNTER — Encounter: Payer: Medicaid Other | Admitting: Obstetrics & Gynecology

## 2017-08-31 VITALS — BP 126/83 | HR 102 | Wt 364.0 lb

## 2017-08-31 DIAGNOSIS — Z8619 Personal history of other infectious and parasitic diseases: Secondary | ICD-10-CM

## 2017-08-31 DIAGNOSIS — O09299 Supervision of pregnancy with other poor reproductive or obstetric history, unspecified trimester: Secondary | ICD-10-CM

## 2017-08-31 DIAGNOSIS — F419 Anxiety disorder, unspecified: Secondary | ICD-10-CM

## 2017-08-31 DIAGNOSIS — K5900 Constipation, unspecified: Secondary | ICD-10-CM

## 2017-08-31 DIAGNOSIS — Z3480 Encounter for supervision of other normal pregnancy, unspecified trimester: Secondary | ICD-10-CM

## 2017-08-31 MED ORDER — HYDROXYZINE PAMOATE 25 MG PO CAPS: 25.0000 mg | ORAL_CAPSULE | Freq: Three times a day (TID) | ORAL | 0 refills | Status: DC | PRN

## 2017-08-31 NOTE — Progress Notes (Signed)
    Subjective:  Melissa Kelly is a 32 y.o. [id number] at 4144w1d being seen today for ongoing prenatal care.  She is currently monitored for the following issues for this high-risk pregnancy and has History of herpes genitalis; Family history of congenital heart defect; Homelessness; Supervision of normal pregnancy, antepartum; Morbid obesity (HCC); Hx of preeclampsia, prior pregnancy, currently pregnant; Group B streptococcal bacteriuria; and Constipation on their problem list.  Patient reports feeling weak and dizzy, is not eating or drinking much, no appetite, feeling anxious and heart is racing about 7 times a day. Today she got up at 8 am but only ate a sandwich at 3 pm and was swaying on the scales at 4 pm today.  Contractions: Not present. Vag. Bleeding: None.  Movement: Present. Denies leaking of fluid.  The following portions of the patient's history were reviewed and updated as appropriate: allergies, current medications, past family history, past medical history, past social history, past surgical history and problem list. Problem list updated.  Objective:   Vitals:   08/31/17 1617  BP: 126/83  Pulse: (!) 102  Weight: (!) 364 lb (165.1 kg)    Fetal Status: Fetal Heart Rate (bpm): 140 Fundal Height: 34 cm Movement: Present     General:  Alert, oriented and cooperative. Patient is in no acute distress.  Skin: Skin is warm and dry. No rash noted.   Cardiovascular: Normal heart rate noted  Respiratory: Normal respiratory effort, no problems with respiration noted  Abdomen: Soft, gravid, appropriate for gestational age. Pain/Pressure: Present     Pelvic:  Cervical exam deferred        Extremities: Normal range of motion.  Edema: None  Mental Status: Normal mood and affect. Normal behavior. Normal judgment and thought content.   Urinalysis:      Assessment and Plan:  Pregnancy: [id number] at 3444w1d  1. Supervision of other normal pregnancy, antepartum Consult with Dr. Debroah LoopArnold re:  symptoms today.  Is not taking in enough food or fluids.  Weight has been declining.    2. Hx of preeclampsia, prior pregnancy, currently pregnant Taking ASA, BP today is normal  3. History of herpes genitalis Plan for suppression at 36 weeks  4. Constipation, unspecified constipation type Stressed the importance of appropriate fluids - 64 ounces daily  Advised to get OTC stool softener which client has been intending to do - this may be needed to help her regain her appetite.  5. Anxiety Very calm at her visit today.  Has not had anxiety before this pregnancy but client calls these symptoms - heart racing and weakness - could be from anxiety or from inadequate fluid and food intake.  Will give medication for anxiety and see if her symptoms improve,.  If no improvement in heart racing, would refer to cardiologist for evaluation.  Advised to pick up medication - vistaril - and to know that the meds may make her sleepy.  Preterm labor symptoms and general obstetric precautions including but not limited to vaginal bleeding, contractions, leaking of fluid and fetal movement were reviewed in detail with the patient. Please refer to After Visit Summary for other counseling recommendations.  Return in about 1 week (around 09/07/2017) for [facility] with MD.  Nolene BernheimERRI Saide Lanuza, RN, MSN, NP-BC Nurse Practitioner, [facility], [facility] 08/31/2017 4:44 PM

## 2017-08-31 NOTE — Patient Instructions (Addendum)
Risks of Formula Supplementation with Breastfeeding Giving your infant formula in addition to your breast-milk EXCEPT when medically necessary can lead to: Marland Kitchen Decreases your milk supply  . Loss of confidence in yourself for providing baby's nutrition  . Engorgement and possibly mastitis  . Asthma & allergies in the baby    Constipation, Adult Constipation is when a person:  Poops (has a bowel movement) fewer times in a week than normal.  Has a hard time pooping.  Has poop that is dry, hard, or bigger than normal.  Follow these instructions at home: Eating and drinking   Eat foods that have a lot of fiber, such as: ? Fresh fruits and vegetables. ? Whole grains. ? Beans.  Eat less of foods that are high in fat, low in fiber, or overly processed, such as: ? [place] fries. ? Hamburgers. ? Cookies. ? Candy. ? Soda.  Drink enough fluid to keep your pee (urine) clear or pale yellow. General instructions  Exercise regularly or as told by your doctor.  Go to the restroom when you feel like you need to poop. Do not hold it in.  Take over-the-counter and prescription medicines only as told by your doctor. These include any fiber supplements.  Do pelvic floor retraining exercises, such as: ? Doing deep breathing while relaxing your lower belly (abdomen). ? Relaxing your pelvic floor while pooping.  Watch your condition for any changes.  Keep all follow-up visits as told by your doctor. This is important. Contact a doctor if:  You have pain that gets worse.  You have a fever.  You have not pooped for 4 days.  You throw up (vomit).  You are not hungry.  You lose weight.  You are bleeding from the anus.  You have thin, pencil-like poop (stool). Get help right away if:  You have a fever, and your symptoms suddenly get worse.  You leak poop or have blood in your poop.  Your belly feels hard or bigger than normal (is bloated).  You have very bad belly  pain.  You feel dizzy or you faint. This information is not intended to replace advice given to you by your health care provider. Make sure you discuss any questions you have with your health care provider. Document Released: 07/08/2007 Document Revised: 08/09/2015 Document Reviewed: 07/10/2015 Elsevier Interactive Patient Education  2018 Elsevier Inc.  Perinatal Anxiety When a woman feels excessive tension or worry (anxiety) during pregnancy or during the first 12 months after she gives birth, she has a condition called perinatal anxiety. Anxiety can interfere with work, school, relationships, and other everyday activities. If it is not managed properly, it can also cause problems in the mother and her baby.  If you are pregnant and you have symptoms of an anxiety disorder, it is important to talk with your health care provider. What are the causes? The exact cause of this condition is not known. Hormonal changes during and after pregnancy may play a role in causing perinatal anxiety. What increases the risk? You are more likely to develop this condition if:  You have a personal or family history of depression, anxiety, or mood disorders.  You experience a stressful life event during pregnancy, such as the death of a loved one.  You have a lot of regular life stress, such as being a single parent.  You have thyroid problems.  What are the signs or symptoms? Perinatal anxiety can be different for everyone. It may include:  Panic attacks (panic  disorder). These are intense episodes of fear or discomfort that may also cause sweating, nausea, shortness of breath, or fear of dying. They usually last 5-15 minutes.  Reliving an upsetting (traumatic) event through distressing thoughts, dreams, or flashbacks (post-traumatic stress disorder, or PTSD).  Excessive worry about multiple problems (generalized anxiety disorder).  Fear and stress about leaving certain people or loved ones  (separation anxiety).  Performing repetitive tasks (compulsions) to relieve stress or worry (obsessive compulsive disorder, or OCD).  Fear of certain objects or situations (phobias).  Excessive worrying, such as a constant feeling that something bad is going to happen.  Inability to relax.  Difficulty concentrating.  Sleep problems.  Frequent nightmares or disturbing thoughts.  How is this diagnosed? This condition is diagnosed based on a physical exam and mental evaluation. In some cases, your health care provider may use an anxiety screening tool. These tools include a list of questions that can help a health care provider diagnose anxiety. Your health care provider may refer you to a mental health expert who specializes in anxiety. How is this treated? This condition may be treated with:  Medicines. Your health care provider will only give you medicines that have been proven safe for pregnancy and breastfeeding.  Talk therapy with a mental health professional to help change your patterns of thinking (cognitive behavioral therapy).  Mindfulness-based stress reduction.  Other relaxation therapies, such as deep breathing or guided muscle relaxation.  Support groups.  Follow these instructions at home: Lifestyle  Do not use any products that contain nicotine or tobacco, such as cigarettes and e-cigarettes. If you need help quitting, ask your health care provider.  Do not use alcohol when you are pregnant. After your baby is born, limit alcohol intake to no more than 1 drink a day. One drink equals 12 oz of beer, 5 oz of wine, or 1 oz of hard liquor.  Consider joining a support group for new mothers. Ask your health care provider for recommendations.  Take good care of yourself. Make sure you: ? Get plenty of sleep. If you are having trouble sleeping, talk with your health care provider. ? Eat a healthy diet. This includes plenty of fruits and vegetables, whole grains, and  lean proteins. ? Exercise regularly, as told by your health care provider. Ask your health care provider what exercises are safe for you. General instructions  Take over-the-counter and prescription medicines only as told by your health care provider.  Talk with your partner or family members about your feelings during pregnancy. Share any concerns or fears that you may have.  Ask for help with tasks or chores when you need it. Ask friends and family members to provide meals, watch your children, or help with cleaning.  Keep all follow-up visits as told by your health care provider. This is important. Contact a health care provider if:  You (or people close to you) notice that you have any symptoms of anxiety or depression.  You have anxiety and your symptoms get worse.  You experience side effects from medicines, such as nausea or sleep problems. Get help right away if:  You feel like hurting yourself, your baby, or someone else. If you ever feel like you may hurt yourself or others, or have thoughts about taking your own life, get help right away. You can go to your nearest emergency department or call:  Your local emergency services (911 in the [place]  A suicide crisis helpline, such as the National Suicide Prevention  Lifeline at 281-223-75291-(702)455-2867. This is open 24 hours a day.  Summary  Perinatal anxiety is when a woman feels excessive tension or worry during pregnancy or during the first 12 months after she gives birth.  Perinatal anxiety may include panic attacks, post-traumatic stress disorder, separation anxiety, phobias, or generalized anxiety.  Perinatal anxiety can cause physical health problems in the mother and baby if not properly managed.  This condition is treated with medicines, talk therapy, stress reduction therapies, or a combination of two or more treatments.  Talk with your partner or family members about your concerns or fears. Do not be afraid to ask for  help. This information is not intended to replace advice given to you by your health care provider. Make sure you discuss any questions you have with your health care provider. Document Released: 03/18/2016 Document Revised: 03/18/2016 Document Reviewed: 03/18/2016 Elsevier Interactive Patient Education  [employer]2018 Elsevier Inc.

## 2017-09-04 ENCOUNTER — Inpatient Hospital Stay (HOSPITAL_COMMUNITY)
Admission: AD | Admit: 2017-09-04 | Discharge: 2017-09-04 | Disposition: A | Discharge status: Home / Self Care | Payer: Medicaid Other | Source: Ambulatory Visit | Attending: Obstetrics and Gynecology | Admitting: Obstetrics and Gynecology

## 2017-09-04 ENCOUNTER — Encounter (HOSPITAL_COMMUNITY): Payer: Self-pay

## 2017-09-04 ENCOUNTER — Other Ambulatory Visit: Payer: Self-pay

## 2017-09-04 DIAGNOSIS — Z823 Family history of stroke: Secondary | ICD-10-CM | POA: Insufficient documentation

## 2017-09-04 DIAGNOSIS — O09293 Supervision of pregnancy with other poor reproductive or obstetric history, third trimester: Secondary | ICD-10-CM | POA: Diagnosis not present

## 2017-09-04 DIAGNOSIS — Z3A33 33 weeks gestation of pregnancy: Secondary | ICD-10-CM | POA: Diagnosis not present

## 2017-09-04 DIAGNOSIS — R030 Elevated blood-pressure reading, without diagnosis of hypertension: Secondary | ICD-10-CM | POA: Diagnosis not present

## 2017-09-04 DIAGNOSIS — O26893 Other specified pregnancy related conditions, third trimester: Secondary | ICD-10-CM | POA: Diagnosis not present

## 2017-09-04 DIAGNOSIS — O09893 Supervision of other high risk pregnancies, third trimester: Secondary | ICD-10-CM | POA: Insufficient documentation

## 2017-09-04 DIAGNOSIS — F430 Acute stress reaction: Secondary | ICD-10-CM | POA: Diagnosis not present

## 2017-09-04 DIAGNOSIS — Z3A32 32 weeks gestation of pregnancy: Secondary | ICD-10-CM | POA: Insufficient documentation

## 2017-09-04 DIAGNOSIS — O09299 Supervision of pregnancy with other poor reproductive or obstetric history, unspecified trimester: Secondary | ICD-10-CM

## 2017-09-04 DIAGNOSIS — O99343 Other mental disorders complicating pregnancy, third trimester: Secondary | ICD-10-CM | POA: Diagnosis not present

## 2017-09-04 DIAGNOSIS — F411 Generalized anxiety disorder: Secondary | ICD-10-CM | POA: Insufficient documentation

## 2017-09-04 DIAGNOSIS — R0602 Shortness of breath: Secondary | ICD-10-CM | POA: Diagnosis present

## 2017-09-04 DIAGNOSIS — Z3689 Encounter for other specified antenatal screening: Secondary | ICD-10-CM

## 2017-09-04 DIAGNOSIS — R8271 Bacteriuria: Secondary | ICD-10-CM

## 2017-09-04 LAB — COMPREHENSIVE METABOLIC PANEL
ALK PHOS: 48 U/L (ref 38–126)
ALT: 6 U/L (ref 0–44)
ANION GAP: 9 (ref 5–15)
AST: 12 U/L — ABNORMAL LOW (ref 15–41)
Albumin: 2.7 g/dL — ABNORMAL LOW (ref 3.5–5.0)
BILIRUBIN TOTAL: 0.4 mg/dL (ref 0.3–1.2)
BUN: 5 mg/dL — AB (ref 6–20)
CALCIUM: 8.5 mg/dL — AB (ref 8.9–10.3)
CO2: 20 mmol/L — ABNORMAL LOW (ref 22–32)
Chloride: 104 mmol/L (ref 98–111)
Creatinine, Ser: 0.6 mg/dL (ref 0.44–1.00)
GFR calc Af Amer: >60 mL/min (ref >60)
Glucose, Bld: 98 mg/dL (ref 70–99)
POTASSIUM: 3.7 mmol/L (ref 3.5–5.1)
Sodium: 133 mmol/L — ABNORMAL LOW (ref 135–145)
Total Protein: 6.8 g/dL (ref 6.5–8.1)

## 2017-09-04 LAB — URINALYSIS, ROUTINE W REFLEX MICROSCOPIC
Bacteria, UA: NONE SEEN
Bilirubin Urine: NEGATIVE
GLUCOSE, UA: NEGATIVE mg/dL
HGB URINE DIPSTICK: NEGATIVE
Ketones, ur: NEGATIVE mg/dL
NITRITE: NEGATIVE
PH: 5 (ref 5.0–8.0)
Protein, ur: 30 mg/dL — AB
SPECIFIC GRAVITY, URINE: 1.026 (ref 1.005–1.030)

## 2017-09-04 LAB — PROTEIN / CREATININE RATIO, URINE
CREATININE, URINE: 327 mg/dL
PROTEIN CREATININE RATIO: 0.05 mg/mg{creat} (ref 0.00–0.15)
Total Protein, Urine: 16 mg/dL

## 2017-09-04 LAB — CBC
HEMATOCRIT: 30.7 % — AB (ref 36.0–46.0)
HEMOGLOBIN: 10.5 g/dL — AB (ref 12.0–15.0)
MCH: 27 pg (ref 26.0–34.0)
MCHC: 34.2 g/dL (ref 30.0–36.0)
MCV: 78.9 fL (ref 78.0–100.0)
Platelets: 170 10*3/uL (ref 150–400)
RBC: 3.89 MIL/uL (ref 3.87–5.11)
RDW: 14 % (ref 11.5–15.5)
WBC: 6.7 10*3/uL (ref 4.0–10.5)

## 2017-09-04 MED ORDER — ALPRAZOLAM 0.5 MG PO TABS
0.5000 mg | ORAL_TABLET | Freq: Once | ORAL | Status: AC
  Administered 2017-09-04: 0.5 mg via ORAL
  Filled 2017-09-04: qty 1

## 2017-09-04 NOTE — Discharge Instructions (Signed)

## 2017-09-04 NOTE — MAU Note (Signed)
Pt. States difficulty breathing feeling as if throat is tightening up. Feeling like she is out of breath. Also states at headache rated 4/10. Reports dizzy spells. Denies ctx and vaginal bleeding. +FM

## 2017-09-04 NOTE — MAU Note (Signed)
Pt reports heart racing and shortness of breath. States it started around 6pm. States she had a panic attack tonight after the symptoms started. Pt states this has been an ongoing issue with this pregnancy. Has an appointment on Tuesday with OBGYN. Pt states she has a HA, but has not taken anything for it. States it just started. Pt denies contractions, LOF, vaginal bleeding. Reports good fetal movement.

## 2017-09-04 NOTE — MAU Provider Note (Signed)
History     CSN: [id number]  Arrival date and time: 09/04/17 2037   First Provider Initiated Contact with [id number] 09/04/17 2115      No chief complaint on file.  HPI  Melissa HarnessSharlyne Kelly is a 32 y.o. [id number] at 254w5d who presents to MAU with chief complaint of activity-induced SOB and recurrent panic attacks.  Patient states she has trouble catching her breath when walking or exercising and is no longer able to sleep on her back. She also experiences a "racing heart" sensation when feeling anxious. Denies vaginal bleeding, leaking of fluid, decreased fetal movement, fever, falls, or recent illness.    OB History    Gravida  8   Para  6   Term  6   Preterm  0   AB  1   Living  6     SAB  1   TAB  0   Ectopic  0   Multiple  0   Live Births  6           Past Medical History:  Diagnosis Date  . Herpes   . Hyperglycemia during pregnancy 01/16/2015   Random CBG 405 on CMP.  One hour GTT 101 Hemoglobin A1c 5.1 Normal abdominal circumference and AFI on anatomy ultrasound   . Late prenatal care 01/16/2015  . Supervision of other high risk pregnancies, third trimester 01/16/2015    [facility] Prenatal Labs  Dating LMP  Blood type: --/--/O POS, O POS (11/29 1433)   Genetic Screen Too late Antibody:NEG (11/29 1433)  Anatomic US Normal female Rubella: 4.89 (11/29 1727)  GTT  Third trimester: 101 RPR: Non Reactive (11/29 1727)   Flu vaccine  HBsAg: Negative (11/29 1727)   TDaP vaccine    01/16/15                                        HIV:   Neg  Baby Food     Breast         Past Surgical History:  Procedure Laterality Date  . NO PAST SURGERIES      Family History  Problem Relation Age of Onset  . Tuberculosis Mother   . Stroke Father     Social History   Tobacco Use  . Smoking status: Never Smoker  . Smokeless tobacco: Never Used  Substance Use Topics  . Alcohol use: Not Currently    Comment: occasional  . Drug use: No    Allergies: No Known  Allergies  Medications Prior to Admission  Medication Sig Dispense Refill Last Dose  . acetaminophen (TYLENOL) 325 MG tablet Take 2 tablets (650 mg total) by mouth every 6 (six) hours as needed for mild pain or headache. 90 tablet 3 Past Week at Unknown time  . Prenatal Vit w/Fe-Methylfol-FA (PNV PO) Take by mouth.   Past Month at Unknown time  . aspirin EC 81 MG tablet Take 1 tablet (81 mg total) by mouth daily. Take after 12 weeks for prevention of preeclampsia later in pregnancy 300 tablet 2 More than a month at Unknown time  . Cholecalciferol (VITAMIN D3) 20 MCG TABS Take 1 tablet by mouth daily. 75 tablet 3 More than a month at Unknown time  . Doxylamine-Pyridoxine (DICLEGIS) 10-10 MG TBEC Take 2 tablets by mouth at bedtime. If symptoms persist, add one tablet in the morning and one in the afternoon  100 tablet 5 More than a month at Unknown time  . hydrOXYzine (VISTARIL) 25 MG capsule Take 1 capsule (25 mg total) by mouth every 8 (eight) hours as needed. 30 capsule 0 More than a month at Unknown time  . promethazine (PHENERGAN) 25 MG tablet Take 1 tablet (25 mg total) by mouth every 6 (six) hours as needed for nausea or vomiting. 30 tablet 3 More than a month at Unknown time  . senna-docusate (SENOKOT-S) 8.6-50 MG tablet Take 1 tablet by mouth at bedtime as needed for mild constipation. 30 tablet 0 More than a month at Unknown time    Review of Systems  Constitutional: Negative for chills, fatigue and fever.  Respiratory: Positive for shortness of breath.   Gastrointestinal: Negative for abdominal pain, nausea and vomiting.  Genitourinary: Negative for vaginal bleeding, vaginal discharge and vaginal pain.  Neurological: Negative for dizziness, light-headedness and headaches.  All other systems reviewed and are negative.  Physical Exam   Blood pressure 128/83, pulse 91, temperature 98.1 F (36.7 C), resp. rate 18, height 5\' 8"  (1.727 m), weight (!) 368 lb (166.9 kg), last menstrual  period 01/18/2017, SpO2 100 %.  Physical Exam  Nursing note and vitals reviewed. Constitutional: She is oriented to person, place, and time. She appears well-developed and well-nourished.  Cardiovascular: Normal rate and regular rhythm.  Respiratory: Effort normal and breath sounds normal. No respiratory distress. She has no wheezes. She has no rales. She exhibits no tenderness.  Neurological: She is alert and oriented to person, place, and time. She has normal reflexes.  Skin: Skin is warm and dry.  Psychiatric: She has a normal mood and affect. Her behavior is normal. Judgment and thought content normal.    MAU Course  Procedures  MDM Reactive NST: baseline 145, moderate variability, positive accelerations, no decelerations Toco: quiet with occasional uterine irritability Responsive to Xanax administered in MAU Elevated BP x 1, PEC labs normal  Patient Vitals for the past 24 hrs:  BP Temp Pulse Resp SpO2 Height Weight  09/04/17 2225 128/83 - 91 18 - - -  09/04/17 2114 122/85 - (!) 101 - - - -  09/04/17 2047 (!) 142/81 98.1 F (36.7 C) 98 (!) 24 100 % 5\' 8"  (1.727 m) (!) 368 lb (166.9 kg)    Orders Placed This Encounter  Procedures  . Culture, OB Urine  . Urinalysis, Routine w reflex microscopic  . CBC  . Comprehensive metabolic panel  . Protein / creatinine ratio, urine  . Discharge patient   Results for orders placed or performed during the hospital encounter of 09/04/17 (from the past 24 hour(s))  Urinalysis, Routine w reflex microscopic     Status: Abnormal   Collection Time: 09/04/17  8:56 PM  Result Value Ref Range   Color, Urine YELLOW YELLOW   APPearance HAZY (A) CLEAR   Specific Gravity, Urine 1.026 1.005 - 1.030   pH 5.0 5.0 - 8.0   Glucose, UA NEGATIVE NEGATIVE mg/dL   Hgb urine dipstick NEGATIVE NEGATIVE   Bilirubin Urine NEGATIVE NEGATIVE   Ketones, ur NEGATIVE NEGATIVE mg/dL   Protein, ur 30 (A) NEGATIVE mg/dL   Nitrite NEGATIVE NEGATIVE   Leukocytes,  UA TRACE (A) NEGATIVE   RBC / HPF 6-10 0 - 5 RBC/hpf   WBC, UA 11-20 0 - 5 WBC/hpf   Bacteria, UA NONE SEEN NONE SEEN   Squamous Epithelial / LPF 11-20 0 - 5   Mucus PRESENT   Protein / creatinine ratio, urine  Status: None   Collection Time: 09/04/17  8:56 PM  Result Value Ref Range   Creatinine, Urine 327.00 mg/dL   Total Protein, Urine 16 mg/dL   Protein Creatinine Ratio 0.05 0.00 - 0.15 mg/mg[Cre]  CBC     Status: Abnormal   Collection Time: 09/04/17  9:13 PM  Result Value Ref Range   WBC 6.7 4.0 - 10.5 K/uL   RBC 3.89 3.87 - 5.11 MIL/uL   Hemoglobin 10.5 (L) 12.0 - 15.0 g/dL   HCT 09.8 (L) 11.9 - 14.7 %   MCV 78.9 78.0 - 100.0 fL   MCH 27.0 26.0 - 34.0 pg   MCHC 34.2 30.0 - 36.0 g/dL   RDW 82.9 56.2 - 13.0 %   Platelets 170 150 - 400 K/uL  Comprehensive metabolic panel     Status: Abnormal   Collection Time: 09/04/17  9:13 PM  Result Value Ref Range   Sodium 133 (L) 135 - 145 mmol/L   Potassium 3.7 3.5 - 5.1 mmol/L   Chloride 104 98 - 111 mmol/L   CO2 20 (L) 22 - 32 mmol/L   Glucose, Bld 98 70 - 99 mg/dL   BUN 5 (L) 6 - 20 mg/dL   Creatinine, Ser 8.65 0.44 - 1.00 mg/dL   Calcium 8.5 (L) 8.9 - 10.3 mg/dL   Total Protein 6.8 6.5 - 8.1 g/dL   Albumin 2.7 (L) 3.5 - 5.0 g/dL   AST 12 (L) 15 - 41 U/L   ALT 6 0 - 44 U/L   Alkaline Phosphatase 48 38 - 126 U/L   Total Bilirubin 0.4 0.3 - 1.2 mg/dL   GFR calc non Af Amer >60 >60 mL/min   GFR calc Af Amer >60 >60 mL/min   Anion gap 9 5 - 15     Assessment and Plan  --Reactive NST --Elevated BP x 1, normal PEC labs --Acute anxiety resolved with talking, reassurance, Xanax x 1 --Discussed Hulda Marin, LCSW at [facility] as excellent resource for stress management --Reviewed general obstetric precautions including but not limited to falls, fever, vaginal bleeding, leaking of fluid, decreased fetal movement, headache not relieved by Tylenol, rest and PO hydration.  --Discharge home in stable condition  F/U: Appt  09/07/17 at [facility], CNM 09/04/2017, 10:37 PM

## 2017-09-06 LAB — CULTURE, OB URINE

## 2017-09-07 ENCOUNTER — Other Ambulatory Visit: Payer: Self-pay

## 2017-09-07 ENCOUNTER — Encounter: Payer: Self-pay | Admitting: Obstetrics

## 2017-09-07 ENCOUNTER — Ambulatory Visit (INDEPENDENT_AMBULATORY_CARE_PROVIDER_SITE_OTHER): Payer: Medicaid Other | Admitting: Obstetrics

## 2017-09-07 VITALS — BP 127/82 | HR 96 | Wt 364.0 lb

## 2017-09-07 DIAGNOSIS — Z23 Encounter for immunization: Secondary | ICD-10-CM | POA: Diagnosis not present

## 2017-09-07 DIAGNOSIS — A6009 Herpesviral infection of other urogenital tract: Secondary | ICD-10-CM

## 2017-09-07 DIAGNOSIS — Z3483 Encounter for supervision of other normal pregnancy, third trimester: Secondary | ICD-10-CM

## 2017-09-07 DIAGNOSIS — Z3480 Encounter for supervision of other normal pregnancy, unspecified trimester: Secondary | ICD-10-CM

## 2017-09-07 MED ORDER — VALACYCLOVIR HCL 1 G PO TABS: 1000.0000 mg | ORAL_TABLET | Freq: Every day | ORAL | 11 refills | Status: DC

## 2017-09-07 NOTE — Progress Notes (Signed)
Subjective:  Melissa Kelly is a 32 y.o. [id number] at 4156w1d being seen today for ongoing prenatal care.  She is currently monitored for the following issues for this high-risk pregnancy and has History of herpes genitalis; Family history of congenital heart defect; Homelessness; Supervision of normal pregnancy, antepartum; Morbid obesity (HCC); Hx of preeclampsia, prior pregnancy, currently pregnant; Group B streptococcal bacteriuria; and Constipation on their problem list.  Patient reports no complaints.  Contractions: Irregular. Vag. Bleeding: None.  Movement: Present. Denies leaking of fluid.   The following portions of the patient's history were reviewed and updated as appropriate: allergies, current medications, past family history, past medical history, past social history, past surgical history and problem list. Problem list updated.  Objective:   Vitals:   09/07/17 1425  BP: 127/82  Pulse: 96  Weight: (!) 364 lb (165.1 kg)    Fetal Status: Fetal Heart Rate (bpm): 140   Movement: Present     General:  Alert, oriented and cooperative. Patient is in no acute distress.  Skin: Skin is warm and dry. No rash noted.   Cardiovascular: Normal heart rate noted  Respiratory: Normal respiratory effort, no problems with respiration noted  Abdomen: Soft, gravid, appropriate for gestational age. Pain/Pressure: Present     Pelvic:  Cervical exam deferred        Extremities: Normal range of motion.  Edema: None  Mental Status: Normal mood and affect. Normal behavior. Normal judgment and thought content.   Urinalysis:      Assessment and Plan:  Pregnancy: [id number] at 5456w1d  1. Supervision of other normal pregnancy, antepartum  2. Herpes genitalis in women Rx; - valACYclovir (VALTREX) 1000 MG tablet; Take 1 tablet (1,000 mg total) by mouth daily for suppression.  Dispense: 30 tablet; Refill: 11  Preterm labor symptoms and general obstetric precautions including but not limited to vaginal  bleeding, contractions, leaking of fluid and fetal movement were reviewed in detail with the patient. Please refer to After Visit Summary for other counseling recommendations.  Return in about 2 weeks (around 09/21/2017) for ROB.   Brock BadHarper, Hodan Wurtz A, MD

## 2017-09-21 ENCOUNTER — Encounter: Payer: Self-pay | Admitting: Obstetrics

## 2017-09-21 ENCOUNTER — Ambulatory Visit (INDEPENDENT_AMBULATORY_CARE_PROVIDER_SITE_OTHER): Payer: Medicaid Other | Admitting: Obstetrics

## 2017-09-21 VITALS — BP 121/80 | HR 87 | Wt 365.2 lb

## 2017-09-21 DIAGNOSIS — Z8619 Personal history of other infectious and parasitic diseases: Secondary | ICD-10-CM

## 2017-09-21 DIAGNOSIS — O09299 Supervision of pregnancy with other poor reproductive or obstetric history, unspecified trimester: Secondary | ICD-10-CM

## 2017-09-21 DIAGNOSIS — Z3483 Encounter for supervision of other normal pregnancy, third trimester: Secondary | ICD-10-CM

## 2017-09-21 DIAGNOSIS — Z3480 Encounter for supervision of other normal pregnancy, unspecified trimester: Secondary | ICD-10-CM

## 2017-09-21 DIAGNOSIS — Z6841 Body Mass Index (BMI) 40.0 and over, adult: Secondary | ICD-10-CM

## 2017-09-21 DIAGNOSIS — O09293 Supervision of pregnancy with other poor reproductive or obstetric history, third trimester: Secondary | ICD-10-CM

## 2017-09-21 DIAGNOSIS — R8271 Bacteriuria: Secondary | ICD-10-CM

## 2017-09-21 NOTE — Progress Notes (Signed)
Subjective:  Delbert HarnessSharlyne Tinkle is a 32 y.o. [id number] at 8570w1d being seen today for ongoing prenatal care.  She is currently monitored for the following issues for this high-risk pregnancy and has History of herpes genitalis; Family history of congenital heart defect; Homelessness; Supervision of normal pregnancy, antepartum; Morbid obesity (HCC); Hx of preeclampsia, prior pregnancy, currently pregnant; Group B streptococcal bacteriuria; and Constipation on their problem list.  Patient reports no complaints.  Contractions: Not present. Vag. Bleeding: None.  Movement: Present. Denies leaking of fluid.   The following portions of the patient's history were reviewed and updated as appropriate: allergies, current medications, past family history, past medical history, past social history, past surgical history and problem list. Problem list updated.  Objective:   Vitals:   09/21/17 1116  BP: 121/80  Pulse: 87  Weight: (!) 365 lb 3.2 oz (165.7 kg)    Fetal Status:     Movement: Present     General:  Alert, oriented and cooperative. Patient is in no acute distress.  Skin: Skin is warm and dry. No rash noted.   Cardiovascular: Normal heart rate noted  Respiratory: Normal respiratory effort, no problems with respiration noted  Abdomen: Soft, gravid, appropriate for gestational age. Pain/Pressure: Present     Pelvic:  Cervical exam deferred        Extremities: Normal range of motion.  Edema: None  Mental Status: Normal mood and affect. Normal behavior. Normal judgment and thought content.   Urinalysis:      Assessment and Plan:  Pregnancy: [id number] at 2770w1d  1. Supervision of other normal pregnancy, antepartum  2. Hx of preeclampsia, prior pregnancy, currently pregnant - Baby ASA Rx  3. History of herpes genitalis - Valtrex suppression  4. Group B streptococcal bacteriuria - treat in labor  5. Class 3 severe obesity due to excess calories without serious comorbidity with body mass  index (BMI) of 50.0 to 59.9 in adult [facility](HCC)   Term labor symptoms and general obstetric precautions including but not limited to vaginal bleeding, contractions, leaking of fluid and fetal movement were reviewed in detail with the patient. Please refer to After Visit Summary for other counseling recommendations.  Return in about 1 week (around 09/28/2017) for ROB.   Brock BadHarper, Quynn Vilchis A, MD

## 2017-09-21 NOTE — Progress Notes (Signed)
Patient reports good fetal movement with pressure, denies contractions. 

## 2017-09-30 ENCOUNTER — Encounter: Payer: Medicaid Other | Admitting: Obstetrics

## 2017-10-07 ENCOUNTER — Other Ambulatory Visit (HOSPITAL_COMMUNITY)
Admission: RE | Admit: 2017-10-07 | Discharge: 2017-10-07 | Disposition: A | Discharge status: Home / Self Care | Payer: Medicaid Other | Source: Ambulatory Visit | Attending: Obstetrics | Admitting: Obstetrics

## 2017-10-07 ENCOUNTER — Encounter: Payer: Self-pay | Admitting: Obstetrics

## 2017-10-07 ENCOUNTER — Ambulatory Visit (INDEPENDENT_AMBULATORY_CARE_PROVIDER_SITE_OTHER): Payer: Medicaid Other | Admitting: Obstetrics

## 2017-10-07 VITALS — BP 118/84 | HR 90 | Wt 363.6 lb

## 2017-10-07 DIAGNOSIS — Z3483 Encounter for supervision of other normal pregnancy, third trimester: Secondary | ICD-10-CM | POA: Diagnosis not present

## 2017-10-07 DIAGNOSIS — I10 Essential (primary) hypertension: Secondary | ICD-10-CM | POA: Diagnosis not present

## 2017-10-07 DIAGNOSIS — Z6841 Body Mass Index (BMI) 40.0 and over, adult: Secondary | ICD-10-CM

## 2017-10-07 DIAGNOSIS — O09299 Supervision of pregnancy with other poor reproductive or obstetric history, unspecified trimester: Secondary | ICD-10-CM

## 2017-10-07 DIAGNOSIS — Z3480 Encounter for supervision of other normal pregnancy, unspecified trimester: Secondary | ICD-10-CM | POA: Diagnosis present

## 2017-10-07 DIAGNOSIS — Z8619 Personal history of other infectious and parasitic diseases: Secondary | ICD-10-CM

## 2017-10-07 DIAGNOSIS — R8271 Bacteriuria: Secondary | ICD-10-CM

## 2017-10-07 NOTE — Progress Notes (Signed)
Subjective:  Melissa Kelly is a 32 y.o. [id number] at [redacted]w[redacted]d being seen today for ongoing prenatal care.  She is currently monitored for the following issues for this low-risk pregnancy and has History of herpes genitalis; Family history of congenital heart defect; Homelessness; Supervision of normal pregnancy, antepartum; Morbid obesity (HCC); Hx of preeclampsia, prior pregnancy, currently pregnant; Group B streptococcal bacteriuria; and Constipation on their problem list.  Patient reports no complaints.  Contractions: Not present. Vag. Bleeding: None.  Movement: Present. Denies leaking of fluid.   The following portions of the patient's history were reviewed and updated as appropriate: allergies, current medications, past family history, past medical history, past social history, past surgical history and problem list. Problem list updated.  Objective:   Vitals:   10/07/17 0827  BP: 118/84  Pulse: 90  Weight: (!) 363 lb 9.6 oz (164.9 kg)    Fetal Status: Fetal Heart Rate (bpm): 140   Movement: Present     General:  Alert, oriented and cooperative. Patient is in no acute distress.  Skin: Skin is warm and dry. No rash noted.   Cardiovascular: Normal heart rate noted  Respiratory: Normal respiratory effort, no problems with respiration noted  Abdomen: Soft, gravid, appropriate for gestational age. Pain/Pressure: Present     Pelvic:  Cervical exam deferred        Extremities: Normal range of motion.  Edema: None  Mental Status: Normal mood and affect. Normal behavior. Normal judgment and thought content.   Urinalysis:      Assessment and Plan:  Pregnancy: [id number] at [redacted]w[redacted]d  1. Supervision of other normal pregnancy, antepartum Rx: - GC/Chlamydia probe amp ([place])not at [facility]  2. Group B streptococcal bacteriuria - treat in labor  3. Hx of preeclampsia, prior pregnancy, currently pregnant  4. History of herpes genitalis  5. Class 3 severe obesity due to excess calories  without serious comorbidity with body mass index (BMI) of 50.0 to 59.9 in adult [facility])   Term labor symptoms and general obstetric precautions including but not limited to vaginal bleeding, contractions, leaking of fluid and fetal movement were reviewed in detail with the patient. Please refer to After Visit Summary for other counseling recommendations.  Return in about 1 week (around 10/14/2017) for ROB.   Brock Bad, MD

## 2017-10-07 NOTE — Progress Notes (Signed)
Pt is G8P6 [redacted]w[redacted]d here for ROB.

## 2017-10-08 LAB — GC/CHLAMYDIA PROBE AMP (~~LOC~~) NOT AT ARMC
CHLAMYDIA, DNA PROBE: NEGATIVE
NEISSERIA GONORRHEA: NEGATIVE

## 2017-10-13 ENCOUNTER — Encounter: Payer: Medicaid Other | Admitting: Obstetrics

## 2017-10-20 ENCOUNTER — Telehealth (HOSPITAL_COMMUNITY): Payer: Self-pay | Admitting: *Deleted

## 2017-10-20 ENCOUNTER — Encounter: Payer: Self-pay | Admitting: Obstetrics

## 2017-10-20 ENCOUNTER — Ambulatory Visit (INDEPENDENT_AMBULATORY_CARE_PROVIDER_SITE_OTHER): Payer: Medicaid Other | Admitting: Obstetrics

## 2017-10-20 VITALS — BP 119/88 | HR 103 | Wt 362.0 lb

## 2017-10-20 DIAGNOSIS — R8271 Bacteriuria: Secondary | ICD-10-CM

## 2017-10-20 DIAGNOSIS — O09299 Supervision of pregnancy with other poor reproductive or obstetric history, unspecified trimester: Secondary | ICD-10-CM

## 2017-10-20 DIAGNOSIS — Z3480 Encounter for supervision of other normal pregnancy, unspecified trimester: Secondary | ICD-10-CM

## 2017-10-20 DIAGNOSIS — Z8619 Personal history of other infectious and parasitic diseases: Secondary | ICD-10-CM

## 2017-10-20 DIAGNOSIS — Z6841 Body Mass Index (BMI) 40.0 and over, adult: Secondary | ICD-10-CM

## 2017-10-20 NOTE — Progress Notes (Signed)
Pt presents for ROB. No complaints.   

## 2017-10-20 NOTE — Progress Notes (Signed)
Subjective:  Melissa HarnessSharlyne Kelly is a 32 y.o. [id number] at 5934w2d being seen today for ongoing prenatal care.  She is currently monitored for the following issues for this low-risk pregnancy and has History of herpes genitalis; Family history of congenital heart defect; Homelessness; Supervision of normal pregnancy, antepartum; Morbid obesity (HCC); Hx of preeclampsia, prior pregnancy, currently pregnant; Group B streptococcal bacteriuria; and Constipation on their problem list.  Patient reports occasional contractions.  Contractions: Irregular. Vag. Bleeding: None.  Movement: Present. Denies leaking of fluid.   The following portions of the patient's history were reviewed and updated as appropriate: allergies, current medications, past family history, past medical history, past social history, past surgical history and problem list. Problem list updated.  Objective:   Vitals:   10/20/17 1008  BP: 119/88  Pulse: (!) 103  Weight: (!) 362 lb (164.2 kg)    Fetal Status: Fetal Heart Rate (bpm): 140   Movement: Present     General:  Alert, oriented and cooperative. Patient is in no acute distress.  Skin: Skin is warm and dry. No rash noted.   Cardiovascular: Normal heart rate noted  Respiratory: Normal respiratory effort, no problems with respiration noted  Abdomen: Soft, gravid, appropriate for gestational age. Pain/Pressure: Present     Pelvic:  Cervical exam deferred        Extremities: Normal range of motion.  Edema: Trace  Mental Status: Normal mood and affect. Normal behavior. Normal judgment and thought content.   Urinalysis:      Assessment and Plan:  Pregnancy: [id number] at 6934w2d  1. Supervision of other normal pregnancy, antepartum  2. Hx of preeclampsia, prior pregnancy, currently pregnant - noncompliant with Baby ASA  3. History of herpes genitalis - on suppression  4. Class 3 severe obesity due to excess calories without serious comorbidity with body mass index (BMI) of  50.0 to 59.9 in adult (HCC)  5. Group B streptococcal bacteriuria - treated.  Treat in labor.  Term labor symptoms and general obstetric precautions including but not limited to vaginal bleeding, contractions, leaking of fluid and fetal movement were reviewed in detail with the patient. Please refer to After Visit Summary for other counseling recommendations.  Return in about 1 week (around 10/27/2017) for ROB.   Brock BadHarper, Charles A, MD

## 2017-10-20 NOTE — Telephone Encounter (Signed)
Preadmission screen  

## 2017-10-27 ENCOUNTER — Encounter: Payer: Self-pay | Admitting: Obstetrics

## 2017-10-27 ENCOUNTER — Ambulatory Visit (INDEPENDENT_AMBULATORY_CARE_PROVIDER_SITE_OTHER): Payer: Medicaid Other | Admitting: Obstetrics

## 2017-10-27 VITALS — Wt 364.0 lb

## 2017-10-27 DIAGNOSIS — O09293 Supervision of pregnancy with other poor reproductive or obstetric history, third trimester: Secondary | ICD-10-CM

## 2017-10-27 DIAGNOSIS — R8271 Bacteriuria: Secondary | ICD-10-CM

## 2017-10-27 DIAGNOSIS — Z3480 Encounter for supervision of other normal pregnancy, unspecified trimester: Secondary | ICD-10-CM

## 2017-10-27 DIAGNOSIS — O480 Post-term pregnancy: Secondary | ICD-10-CM | POA: Diagnosis not present

## 2017-10-27 DIAGNOSIS — Z6841 Body Mass Index (BMI) 40.0 and over, adult: Secondary | ICD-10-CM

## 2017-10-27 DIAGNOSIS — O09299 Supervision of pregnancy with other poor reproductive or obstetric history, unspecified trimester: Secondary | ICD-10-CM

## 2017-10-27 DIAGNOSIS — Z3483 Encounter for supervision of other normal pregnancy, third trimester: Secondary | ICD-10-CM

## 2017-10-27 NOTE — Progress Notes (Signed)
Subjective:  Melissa Kelly is a 32 y.o. [id number] at [redacted]w[redacted]d being seen today for ongoing prenatal care.  She is currently monitored for the following issues for this high-risk pregnancy and has History of herpes genitalis; Family history of congenital heart defect; Homelessness; Supervision of normal pregnancy, antepartum; Morbid obesity (HCC); Hx of preeclampsia, prior pregnancy, currently pregnant; Group B streptococcal bacteriuria; and Constipation on their problem list.  Patient reports no complaints.  Contractions: Not present. Vag. Bleeding: None.  Movement: Present. Denies leaking of fluid.   The following portions of the patient's history were reviewed and updated as appropriate: allergies, current medications, past family history, past medical history, past social history, past surgical history and problem list. Problem list updated.  Objective:   Vitals:   10/27/17 0957  Weight: (!) 364 lb (165.1 kg)    Fetal Status: Fetal Heart Rate (bpm): NST - R   Movement: Present     General:  Alert, oriented and cooperative. Patient is in no acute distress.  Skin: Skin is warm and dry. No rash noted.   Cardiovascular: Normal heart rate noted  Respiratory: Normal respiratory effort, no problems with respiration noted  Abdomen: Soft, gravid, appropriate for gestational age. Pain/Pressure: Absent     Pelvic:  Cervical exam deferred        Extremities: Normal range of motion.  Edema: Trace  Mental Status: Normal mood and affect. Normal behavior. Normal judgment and thought content.   Urinalysis:      Assessment and Plan:  Pregnancy: [id number] at [redacted]w[redacted]d  1. Supervision of other normal pregnancy, antepartum  2. Post-term pregnancy, 40-42 weeks of gestation Rx: - Fetal nonstress test:  Reactive.  FHR=150's, good variability, 15x15 accels, no decels, no UC's  3. Hx of preeclampsia, prior pregnancy, currently pregnant  4. Class 3 severe obesity due to excess calories without serious  comorbidity with body mass index (BMI) of 50.0 to 59.9 in adult (HCC)  5. Group B streptococcal bacteriuria - treat in labor  Term labor symptoms and general obstetric precautions including but not limited to vaginal bleeding, contractions, leaking of fluid and fetal movement were reviewed in detail with the patient. Please refer to After Visit Summary for other counseling recommendations.     Brock Bad, MD

## 2017-11-01 ENCOUNTER — Encounter (HOSPITAL_COMMUNITY): Payer: Self-pay

## 2017-11-01 ENCOUNTER — Inpatient Hospital Stay (HOSPITAL_COMMUNITY)
Admission: RE | Admit: 2017-11-01 | Discharge: 2017-11-03 | DRG: 806 | Disposition: A | Discharge status: Home / Self Care | Payer: Medicaid Other | Attending: Obstetrics and Gynecology | Admitting: Obstetrics and Gynecology

## 2017-11-01 ENCOUNTER — Other Ambulatory Visit: Payer: Self-pay

## 2017-11-01 ENCOUNTER — Inpatient Hospital Stay (HOSPITAL_COMMUNITY): Payer: Medicaid Other | Admitting: Anesthesiology

## 2017-11-01 DIAGNOSIS — A6000 Herpesviral infection of urogenital system, unspecified: Secondary | ICD-10-CM | POA: Diagnosis present

## 2017-11-01 DIAGNOSIS — O9832 Other infections with a predominantly sexual mode of transmission complicating childbirth: Secondary | ICD-10-CM | POA: Diagnosis present

## 2017-11-01 DIAGNOSIS — O9852 Other viral diseases complicating childbirth: Secondary | ICD-10-CM | POA: Diagnosis not present

## 2017-11-01 DIAGNOSIS — O99214 Obesity complicating childbirth: Secondary | ICD-10-CM | POA: Diagnosis present

## 2017-11-01 DIAGNOSIS — Z5900 Homelessness unspecified: Secondary | ICD-10-CM

## 2017-11-01 DIAGNOSIS — R8271 Bacteriuria: Secondary | ICD-10-CM | POA: Diagnosis present

## 2017-11-01 DIAGNOSIS — O480 Post-term pregnancy: Principal | ICD-10-CM | POA: Diagnosis present

## 2017-11-01 DIAGNOSIS — Z8619 Personal history of other infectious and parasitic diseases: Secondary | ICD-10-CM | POA: Diagnosis present

## 2017-11-01 DIAGNOSIS — O99824 Streptococcus B carrier state complicating childbirth: Secondary | ICD-10-CM | POA: Diagnosis present

## 2017-11-01 DIAGNOSIS — O09299 Supervision of pregnancy with other poor reproductive or obstetric history, unspecified trimester: Secondary | ICD-10-CM

## 2017-11-01 DIAGNOSIS — Z3A41 41 weeks gestation of pregnancy: Secondary | ICD-10-CM | POA: Diagnosis not present

## 2017-11-01 LAB — TYPE AND SCREEN
ABO/RH(D): O POS
ANTIBODY SCREEN: NEGATIVE

## 2017-11-01 LAB — CBC
HEMATOCRIT: 33.9 % — AB (ref 36.0–46.0)
HEMOGLOBIN: 11.1 g/dL — AB (ref 12.0–15.0)
MCH: 25.9 pg — ABNORMAL LOW (ref 26.0–34.0)
MCHC: 32.7 g/dL (ref 30.0–36.0)
MCV: 79.2 fL (ref 78.0–100.0)
Platelets: 164 10*3/uL (ref 150–400)
RBC: 4.28 MIL/uL (ref 3.87–5.11)
RDW: 14.7 % (ref 11.5–15.5)
WBC: 7 10*3/uL (ref 4.0–10.5)

## 2017-11-01 LAB — RPR: RPR: NONREACTIVE

## 2017-11-01 MED ORDER — ZOLPIDEM TARTRATE 5 MG PO TABS: 5.0000 mg | ORAL_TABLET | Freq: Every evening | ORAL | Status: DC | PRN

## 2017-11-01 MED ORDER — COCONUT OIL OIL: 1.0000 "application " | TOPICAL_OIL | Status: DC | PRN

## 2017-11-01 MED ORDER — PRENATAL MULTIVITAMIN CH
1.0000 | ORAL_TABLET | Freq: Every day | ORAL | Status: DC
  Administered 2017-11-02 – 2017-11-03 (×2): 1 via ORAL
  Filled 2017-11-01 (×2): qty 1

## 2017-11-01 MED ORDER — ONDANSETRON HCL 4 MG/2ML IJ SOLN: 4.0000 mg | INTRAMUSCULAR | Status: DC | PRN

## 2017-11-01 MED ORDER — PHENYLEPHRINE 40 MCG/ML (10ML) SYRINGE FOR IV PUSH (FOR BLOOD PRESSURE SUPPORT)
80.0000 ug | PREFILLED_SYRINGE | INTRAVENOUS | Status: DC | PRN
  Filled 2017-11-01: qty 5

## 2017-11-01 MED ORDER — FENTANYL CITRATE (PF) 100 MCG/2ML IJ SOLN: 100.0000 ug | INTRAMUSCULAR | Status: DC | PRN

## 2017-11-01 MED ORDER — OXYTOCIN 40 UNITS IN LACTATED RINGERS INFUSION - SIMPLE MED: 1.0000 m[IU]/min | INTRAVENOUS | Status: DC

## 2017-11-01 MED ORDER — MISOPROSTOL 25 MCG QUARTER TABLET
25.0000 ug | ORAL_TABLET | ORAL | Status: DC | PRN
  Administered 2017-11-01: 25 ug via BUCCAL
  Filled 2017-11-01: qty 1

## 2017-11-01 MED ORDER — MISOPROSTOL 50MCG HALF TABLET
50.0000 ug | ORAL_TABLET | ORAL | Status: DC | PRN
  Administered 2017-11-01: 50 ug via BUCCAL
  Filled 2017-11-01 (×2): qty 1

## 2017-11-01 MED ORDER — OXYTOCIN 40 UNITS IN LACTATED RINGERS INFUSION - SIMPLE MED
2.5000 [IU]/h | INTRAVENOUS | Status: DC
  Filled 2017-11-01: qty 1000

## 2017-11-01 MED ORDER — BENZOCAINE-MENTHOL 20-0.5 % EX AERO: 1.0000 "application " | INHALATION_SPRAY | CUTANEOUS | Status: DC | PRN

## 2017-11-01 MED ORDER — IBUPROFEN 600 MG PO TABS
600.0000 mg | ORAL_TABLET | Freq: Four times a day (QID) | ORAL | Status: DC
  Administered 2017-11-01 – 2017-11-03 (×8): 600 mg via ORAL
  Filled 2017-11-01 (×8): qty 1

## 2017-11-01 MED ORDER — DIPHENHYDRAMINE HCL 50 MG/ML IJ SOLN: 12.5000 mg | INTRAMUSCULAR | Status: DC | PRN

## 2017-11-01 MED ORDER — LIDOCAINE HCL (PF) 1 % IJ SOLN
30.0000 mL | INTRAMUSCULAR | Status: DC | PRN
  Filled 2017-11-01: qty 30

## 2017-11-01 MED ORDER — TETANUS-DIPHTH-ACELL PERTUSSIS 5-2.5-18.5 LF-MCG/0.5 IM SUSP: 0.5000 mL | Freq: Once | INTRAMUSCULAR | Status: DC

## 2017-11-01 MED ORDER — DIPHENHYDRAMINE HCL 25 MG PO CAPS: 25.0000 mg | ORAL_CAPSULE | Freq: Four times a day (QID) | ORAL | Status: DC | PRN

## 2017-11-01 MED ORDER — ACETAMINOPHEN 325 MG PO TABS: 650.0000 mg | ORAL_TABLET | ORAL | Status: DC | PRN

## 2017-11-01 MED ORDER — SIMETHICONE 80 MG PO CHEW: 80.0000 mg | CHEWABLE_TABLET | ORAL | Status: DC | PRN

## 2017-11-01 MED ORDER — DIBUCAINE 1 % RE OINT: 1.0000 "application " | TOPICAL_OINTMENT | RECTAL | Status: DC | PRN

## 2017-11-01 MED ORDER — LACTATED RINGERS IV SOLN
500.0000 mL | Freq: Once | INTRAVENOUS | Status: AC
  Administered 2017-11-01: 1000 mL via INTRAVENOUS

## 2017-11-01 MED ORDER — PENICILLIN G 3 MILLION UNITS IVPB - SIMPLE MED
3.0000 10*6.[IU] | INTRAVENOUS | Status: DC
  Administered 2017-11-01: 3 10*6.[IU] via INTRAVENOUS
  Filled 2017-11-01 (×2): qty 100

## 2017-11-01 MED ORDER — SODIUM CHLORIDE 0.9 % IV SOLN
5.0000 10*6.[IU] | Freq: Once | INTRAVENOUS | Status: AC
  Administered 2017-11-01: 5 10*6.[IU] via INTRAVENOUS
  Filled 2017-11-01: qty 5

## 2017-11-01 MED ORDER — EPHEDRINE 5 MG/ML INJ
10.0000 mg | INTRAVENOUS | Status: DC | PRN
  Filled 2017-11-01: qty 2

## 2017-11-01 MED ORDER — LACTATED RINGERS IV SOLN: 500.0000 mL | INTRAVENOUS | Status: DC | PRN

## 2017-11-01 MED ORDER — OXYTOCIN BOLUS FROM INFUSION
500.0000 mL | Freq: Once | INTRAVENOUS | Status: AC
  Administered 2017-11-01: 500 mL via INTRAVENOUS

## 2017-11-01 MED ORDER — LIDOCAINE-EPINEPHRINE (PF) 2 %-1:200000 IJ SOLN
INTRAMUSCULAR | Status: DC | PRN
  Administered 2017-11-01 (×2): 4 mL via EPIDURAL

## 2017-11-01 MED ORDER — WITCH HAZEL-GLYCERIN EX PADS: 1.0000 "application " | MEDICATED_PAD | CUTANEOUS | Status: DC | PRN

## 2017-11-01 MED ORDER — OXYTOCIN 10 UNIT/ML IJ SOLN
10.0000 [IU] | Freq: Once | INTRAMUSCULAR | Status: DC | PRN
  Filled 2017-11-01: qty 1

## 2017-11-01 MED ORDER — OXYCODONE-ACETAMINOPHEN 5-325 MG PO TABS: 1.0000 | ORAL_TABLET | ORAL | Status: DC | PRN

## 2017-11-01 MED ORDER — SENNOSIDES-DOCUSATE SODIUM 8.6-50 MG PO TABS
2.0000 | ORAL_TABLET | ORAL | Status: DC
  Administered 2017-11-02: 2 via ORAL
  Filled 2017-11-01: qty 2

## 2017-11-01 MED ORDER — TERBUTALINE SULFATE 1 MG/ML IJ SOLN
0.2500 mg | Freq: Once | INTRAMUSCULAR | Status: DC | PRN
  Filled 2017-11-01: qty 1

## 2017-11-01 MED ORDER — LACTATED RINGERS IV SOLN
INTRAVENOUS | Status: DC
  Administered 2017-11-01 (×2): via INTRAVENOUS

## 2017-11-01 MED ORDER — OXYCODONE-ACETAMINOPHEN 5-325 MG PO TABS: 2.0000 | ORAL_TABLET | ORAL | Status: DC | PRN

## 2017-11-01 MED ORDER — FENTANYL 2.5 MCG/ML BUPIVACAINE 1/10 % EPIDURAL INFUSION (WH - ANES)
14.0000 mL/h | INTRAMUSCULAR | Status: DC | PRN
  Administered 2017-11-01: 14 mL/h via EPIDURAL
  Filled 2017-11-01: qty 100

## 2017-11-01 MED ORDER — ONDANSETRON HCL 4 MG PO TABS: 4.0000 mg | ORAL_TABLET | ORAL | Status: DC | PRN

## 2017-11-01 MED ORDER — PHENYLEPHRINE 40 MCG/ML (10ML) SYRINGE FOR IV PUSH (FOR BLOOD PRESSURE SUPPORT)
80.0000 ug | PREFILLED_SYRINGE | INTRAVENOUS | Status: DC | PRN
  Filled 2017-11-01: qty 10
  Filled 2017-11-01: qty 5

## 2017-11-01 MED ORDER — SOD CITRATE-CITRIC ACID 500-334 MG/5ML PO SOLN: 30.0000 mL | ORAL | Status: DC | PRN

## 2017-11-01 NOTE — H&P (Addendum)
OBSTETRIC ADMISSION HISTORY AND PHYSICAL  Melissa Kelly is a 32 y.o. female [phone] with IUP at [redacted]w[redacted]d by LMP presenting for IOL for PD. She reports +FMs, No LOF, no VB, no blurry vision, headaches or peripheral edema, and RUQ pain.  She plans on breast and bottle feeding. She request interval BTL for birth control. She received her prenatal care at [facility]   Dating: By LMP --->  Estimated Date of Delivery: 10/25/17  Sono:  @[redacted]w[redacted]d , CWD, normal anatomy, cephalic presentation,  1241g, 34% EFW   Prenatal History/Complications: Post dates prenancy Homelessness fam hx of congenital heart defect Genital HSV   Past Medical History: Past Medical History:  Diagnosis Date  . Herpes   . Hyperglycemia during pregnancy 01/16/2015   Random CBG 405 on CMP.  One hour GTT 101 Hemoglobin A1c 5.1 Normal abdominal circumference and AFI on anatomy ultrasound   . Late prenatal care 01/16/2015  . Supervision of other high risk pregnancies, third trimester 01/16/2015    Clinic [facility] --> [facility] Prenatal Labs  Dating LMP  Blood type: --/--/O POS, O POS (11/29 1433)   Genetic Screen Too late Antibody:NEG (11/29 1433)  Anatomic [place] Normal female Rubella: 4.89 (11/29 1727)  GTT  Third trimester: 101 RPR: Non Reactive (11/29 1727)   Flu vaccine  HBsAg: Negative (11/29 1727)   TDaP vaccine    01/16/15                                        HIV:   Neg  Baby Food     Breast         Past Surgical History: Past Surgical History:  Procedure Laterality Date  . NO PAST SURGERIES      Obstetrical History: OB History    Gravida  8   Para  6   Term  6   Preterm  0   AB  1   Living  6     SAB  1   TAB  0   Ectopic  0   Multiple  0   Live Births  6           Social History: Social History   Socioeconomic History  . Marital status: Single    Spouse name: Not on file  . Number of children: Not on file  . Years of education: Not on file  . Highest education level: Not on file  Occupational  History  . Not on file  Social Needs  . Financial resource strain: Not on file  . Food insecurity:    Worry: Not on file    Inability: Not on file  . Transportation needs:    Medical: Not on file    Non-medical: Not on file  Tobacco Use  . Smoking status: Never Smoker  . Smokeless tobacco: Never Used  Substance and Sexual Activity  . Alcohol use: Not Currently    Comment: occasional  . Drug use: No  . Sexual activity: Yes    Partners: Male  Lifestyle  . Physical activity:    Days per week: Not on file    Minutes per session: Not on file  . Stress: Not on file  Relationships  . Social connections:    Talks on phone: Not on file    Gets together: Not on file    Attends religious service: Not on file    Active member  of club or organization: Not on file    Attends meetings of clubs or organizations: Not on file    Relationship status: Not on file  Other Topics Concern  . Not on file  Social History Narrative  . Not on file    Family History: Family History  Problem Relation Age of Onset  . Tuberculosis Mother   . Stroke Father     Allergies: No Known Allergies  Medications Prior to Admission  Medication Sig Dispense Refill Last Dose  . Cholecalciferol (VITAMIN D3) 20 MCG TABS Take 1 tablet by mouth daily. 75 tablet 3 10/31/2017 at Unknown time  . Prenatal Vit w/Fe-Methylfol-FA (PNV PO) Take 1 tablet by mouth daily.    10/31/2017 at Unknown time  . acetaminophen (TYLENOL) 325 MG tablet Take 2 tablets (650 mg total) by mouth every 6 (six) hours as needed for mild pain or headache. 90 tablet 3 10/11/2017  . hydrOXYzine (VISTARIL) 25 MG capsule Take 1 capsule (25 mg total) by mouth every 8 (eight) hours as needed. (Patient not taking: Reported on 11/01/2017) 30 capsule 0 Not Taking at Unknown time  . valACYclovir (VALTREX) 1000 MG tablet Take 1 tablet (1,000 mg total) by mouth daily. (Patient not taking: Reported on 11/01/2017) 30 tablet 11 Not Taking at Unknown time      Review of Systems   All systems reviewed and negative except as stated in HPI  Blood pressure 118/82, pulse 98, temperature 98.7 F (37.1 C), temperature source Oral, resp. rate 18, height 5\' 8"  (1.727 m), weight (!) 165.5 kg, last menstrual period 01/18/2017. General appearance: alert, cooperative, appears stated age, no distress and morbidly obese Lungs: clear to auscultation bilaterally Heart: regular rate and rhythm Abdomen: soft, non-tender; bowel sounds normal Pelvic: see below Extremities: Homans sign is negative, no sign of DVT Presentation: cephalic by cervical exam Fetal monitoringBaseline: 150 bpm, Variability: Good {> 6 bpm), Accelerations: Reactive and Decelerations: Variable: moderate Uterine activity no painful contractions Dilation: 3.5 Effacement (%): 50 Station: -2 Exam by:: Enis Slipper, RN   Prenatal labs: ABO, Rh: O/Positive/-- (03/15 1012) Antibody: Negative (03/15 1012) Rubella: 3.83 (03/15 1012) RPR: Non Reactive (07/08 1040)  HBsAg: Negative (03/15 1012)  HIV: Non Reactive (07/08 1040)  GBS:   positive in urine 2 hr Glucola normal Genetic screening  Low risk Anatomy US normal  Prenatal Transfer Tool  Maternal Diabetes: No Genetic Screening: Normal Maternal Ultrasounds/Referrals: Normal Fetal Ultrasounds or other Referrals:  None Maternal Substance Abuse:  No Significant Maternal Medications:  None Significant Maternal Lab Results: Lab values include: Group B Strep positive, Other: HSV positive, no pelvic lesions at this time.  Results for orders placed or performed during the hospital encounter of 11/01/17 (from the past 24 hour(s))  CBC   Collection Time: 11/01/17  8:09 AM  Result Value Ref Range   WBC 7.0 4.0 - 10.5 K/uL   RBC 4.28 3.87 - 5.11 MIL/uL   Hemoglobin 11.1 (L) 12.0 - 15.0 g/dL   HCT 29.5 (L) 62.1 - 30.8 %   MCV 79.2 78.0 - 100.0 fL   MCH 25.9 (L) 26.0 - 34.0 pg   MCHC 32.7 30.0 - 36.0 g/dL   RDW 65.7 84.6 - 96.2 %    Platelets 164 150 - 400 K/uL    Patient Active Problem List   Diagnosis Date Noted  . Post-dates pregnancy 11/01/2017  . Indication for care in labor or delivery 11/01/2017  . Constipation 08/31/2017  . Group B streptococcal bacteriuria 04/26/2017  .  Supervision of normal pregnancy, antepartum 04/16/2017  . Morbid obesity (HCC) 04/16/2017  . Hx of preeclampsia, prior pregnancy, currently pregnant 04/16/2017  . Homelessness 01/23/2015  . Family history of congenital heart defect 01/17/2015  . History of herpes genitalis 01/16/2015    Assessment/Plan:  Melissa Kelly is a 33 y.o. [id number] at [redacted]w[redacted]d here forIOL for post dates.  #Labor:beginning induction with cytotec. #Pain: Would like to avoid epidural #FWB: Category II #ID:  GBS positive, PCN, HSV positive, no lesions #MOF: beast/bottle #MOC:interval tubal #Circ:  N/A female  Mirian Mo, MD  11/01/2017, 9:34 AM  I confirm that I have verified the information documented in the resident's note and that I have also personally reperformed the physical exam and all medical decision making activities. The patient was seen and examined by me also Agree with note Did not take Valtrex, but exam was negative today NST reactive and reassuring UCs as listed Cervical exams as listed in note  Aviva Signs, CNM

## 2017-11-01 NOTE — Progress Notes (Signed)
Labor Progress Note Melissa Kelly is a 32 y.o. [id number] at [redacted]w[redacted]d presented for IOL for PD S: Resting in bed comfortably. Contractions are not too painful yet. Would like to avoid epidural.  O:  BP (!) 145/90   Pulse 88   Temp 98.3 F (36.8 C) (Oral)   Resp 18   Ht 5\' 8"  (1.727 m)   Wt (!) 165.5 kg   LMP 01/18/2017 (Exact Date)   BMI 55.48 kg/m  EFM: 145/moderate var/few accels/no decels  CVE: Dilation: 4 Effacement (%): 50 Cervical Position: Middle Station: -2 Presentation: Vertex Exam by:: Dr. Homero Fellers   A&P: 32 y.o. [id number] [redacted]w[redacted]d here for IOL for PD #Labor: Progressing well on buccal cytotec x2 #Pain: IV pain meds, would like to avoid epidural #FWB: category I #GBS positive, PCN   Mirian Mo, MD 1:42 PM

## 2017-11-01 NOTE — Anesthesia Preprocedure Evaluation (Signed)
Anesthesia Evaluation  Patient identified by MRN, date of birth, ID band Patient awake    Reviewed: Allergy & Precautions, NPO status , Patient's Chart, lab work & pertinent test results  Airway Mallampati: II  TM Distance: >3 FB Neck ROM: Full    Dental no notable dental hx. (+) Teeth Intact   Pulmonary neg pulmonary ROS,    Pulmonary exam normal breath sounds clear to auscultation       Cardiovascular negative cardio ROS Normal cardiovascular exam Rhythm:Regular Rate:Normal     Neuro/Psych negative neurological ROS  negative psych ROS   GI/Hepatic negative GI ROS, Neg liver ROS,   Endo/Other  negative endocrine ROSMorbid obesity  Renal/GU negative Renal ROS  negative genitourinary   Musculoskeletal negative musculoskeletal ROS (+)   Abdominal   Peds  Hematology negative hematology ROS (+)   Anesthesia Other Findings   Reproductive/Obstetrics (+) Pregnancy                             Anesthesia Physical Anesthesia Plan  ASA: III  Anesthesia Plan: Epidural   Post-op Pain Management:    Induction:   PONV Risk Score and Plan: Treatment may vary due to age or medical condition  Airway Management Planned: Natural Airway  Additional Equipment:   Intra-op Plan:   Post-operative Plan:   Informed Consent: I have reviewed the patients History and Physical, chart, labs and discussed the procedure including the risks, benefits and alternatives for the proposed anesthesia with the patient or authorized representative who has indicated his/her understanding and acceptance.     Plan Discussed with:   Anesthesia Plan Comments:         Anesthesia Quick Evaluation

## 2017-11-01 NOTE — Anesthesia Pain Management Evaluation Note (Signed)
  CRNA Pain Management Visit Note  Patient: Melissa Kelly, 32 y.o., female  "Hello I am a member of the anesthesia team at [facility]. We have an anesthesia team available at all times to provide care throughout the hospital, including epidural management and anesthesia for C-section. I don't know your plan for the delivery whether it a natural birth, water birth, IV sedation, nitrous supplementation, doula or epidural, but we want to meet your pain goals."   1.Was your pain managed to your expectations on prior hospitalizations?   Yes   2.What is your expectation for pain management during this hospitalization?     Labor support without medications  3.How can we help you reach that goal?   Record the patient's initial score and the patient's pain goal.   Pain: 0  Pain Goal: 8 [facility] wants you to be able to say your pain was always managed very well.  Laban Emperor 11/01/2017

## 2017-11-01 NOTE — Anesthesia Procedure Notes (Signed)
Epidural Patient location during procedure: OB Start time: 11/01/2017 4:20 PM End time: 11/01/2017 4:40 PM  Staffing Anesthesiologist: Elmer Picker, MD Performed: anesthesiologist   Preanesthetic Checklist Completed: patient identified, pre-op evaluation, timeout performed, IV checked, risks and benefits discussed and monitors and equipment checked  Epidural Patient position: sitting Prep: site prepped and draped and DuraPrep Patient monitoring: continuous pulse ox, blood pressure, heart rate and cardiac monitor Approach: midline Location: L3-L4 Injection technique: LOR air  Needle:  Needle type: Tuohy  Needle gauge: 17 G Needle length: 9 cm Needle insertion depth: 9 cm Catheter type: closed end flexible Catheter size: 19 Gauge Catheter at skin depth: 15 cm Test dose: negative  Assessment Sensory level: T8 Events: blood not aspirated, injection not painful, no injection resistance, negative IV test and no paresthesia  Additional Notes Patient identified. Risks/Benefits/Options discussed with patient including but not limited to bleeding, infection, nerve damage, paralysis, failed block, incomplete pain control, headache, blood pressure changes, nausea, vomiting, reactions to medication both or allergic, itching and postpartum back pain. Confirmed with bedside nurse the patient's most recent platelet count. Confirmed with patient that they are not currently taking any anticoagulation, have any bleeding history or any family history of bleeding disorders. Patient expressed understanding and wished to proceed. All questions were answered. Sterile technique was used throughout the entire procedure. Please see nursing notes for vital signs. Test dose was given through epidural catheter and negative prior to continuing to dose epidural or start infusion. Warning signs of high block given to the patient including shortness of breath, tingling/numbness in hands, complete motor block,  or any concerning symptoms with instructions to call for help. Patient was given instructions on fall risk and not to get out of bed. All questions and concerns addressed with instructions to call with any issues or inadequate analgesia.  Reason for block:procedure for pain

## 2017-11-02 ENCOUNTER — Encounter (HOSPITAL_COMMUNITY): Payer: Self-pay
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
## 2017-11-02 NOTE — Anesthesia Postprocedure Evaluation (Signed)
Anesthesia Post Note  Patient: Pensions consultant  Procedure(s) Performed: AN AD HOC LABOR EPIDURAL     Patient location during evaluation: Mother Baby Anesthesia Type: Epidural Level of consciousness: awake, awake and alert and oriented Pain management: pain level controlled Vital Signs Assessment: post-procedure vital signs reviewed and stable Respiratory status: spontaneous breathing and respiratory function stable Cardiovascular status: blood pressure returned to baseline Postop Assessment: no headache, no backache, epidural receding, patient able to bend at knees, no apparent nausea or vomiting, adequate PO intake and able to ambulate Anesthetic complications: no    Last Vitals:  Vitals:   11/02/17 0100 11/02/17 0526  BP: 112/68 131/77  Pulse: 80 77  Resp: 18 18  Temp: 36.4 C 36.7 C    Last Pain:  Vitals:   11/02/17 0725  TempSrc:   PainSc: 0-No pain   Pain Goal:                 Cleda Clarks

## 2017-11-02 NOTE — Lactation Note (Addendum)
This note was copied from a baby's chart. Lactation Consultation Note  Patient Name: Melissa Kelly [id number] Date: 11/02/2017 Reason for consult: Initial assessment;Term;Hyperbilirubinemia  P7 mother whose infant is now 58 hours old.  Baby is under phototherapy bank light  Mother's plan is to breast/bottle feed.  I offered to initiate the DEBP to help increase milk supply and she accepted.  Mother's breasts are soft and non tender and nipples are small and everted bilaterally.  Mother stated that she breast feeds until baby does not want to latch and then supplements with formula.  She knows to call for latch assistance if needed.  Mom made aware of O/P services, breastfeeding support groups, community resources, and our phone # for post-discharge questions.   Pump parts, assembly, disassembly and cleaning of parts reviewed with mother.  Flange size #24 appropriate at this time.  Encouraged mother to do hand expression before/after feedings and breast compressions during pumping to help with increasing milk supply.  Mother will feed back any EBM she obtains from pumping and hand expression.    Encouraged mother to awaken baby every 3 hours to feed or sooner if she awakens and shows feeding cues.  Mother verbalized understanding.  She will call for any further questions/concerns she may have.  RN updated.   Maternal Data Formula Feeding for Exclusion: No Has patient been taught Hand Expression?: Yes  Feeding Feeding Type: Bottle Fed - Formula Length of feed: 20 min  LATCH Score                   Interventions    Lactation Tools Discussed/Used WIC Program: Yes Pump Review: Setup, frequency, and cleaning;Milk Storage Initiated by:: Melissa Kelly Date initiated:: 11/02/17   Consult Status Consult Status: Follow-up Date: 11/03/17 Follow-up type: In-patient    Melissa Kelly R Larine Fielding 11/02/2017, 2:14 PM

## 2017-11-02 NOTE — Progress Notes (Signed)
POSTPARTUM PROGRESS NOTE  Post Partum Day 1  Subjective:  Melissa Kelly is a 32 y.o. [id number] s/p SVD at [redacted]w[redacted]d.  She reports she is doing well. No acute events overnight. She denies any problems with ambulating, voiding or po intake. Denies nausea or vomiting.  Pain is well controlled.  Lochia is appropriate.  Objective: Blood pressure 131/77, pulse 77, temperature 98 F (36.7 C), temperature source Axillary, resp. rate 18, height 5\' 8"  (1.727 m), weight (!) 165.5 kg, last menstrual period 01/18/2017, unknown if currently breastfeeding.  Physical Exam:  General: alert, cooperative and no distress Chest: no respiratory distress Heart:regular rate, distal pulses intact Abdomen: soft, nontender,  Uterine Fundus: firm, appropriately tender DVT Evaluation: No calf swelling or tenderness Extremities: No LE edema Skin: warm, dry  Recent Labs    11/01/17 0809  HGB 11.1*  HCT 33.9*    Assessment/Plan: Melissa Kelly is a 32 y.o. [id number] s/p SVD at [redacted]w[redacted]d   PPD#1 - Doing well  Routine postpartum care Contraception: Interval BTL  Feeding: Breast and bottle  Dispo: Plan for discharge d/c tomorrow.   LOS: 1 day   Marcy Siren, D.O. OB Fellow  11/02/2017, 2:15 PM

## 2017-11-03 MED ORDER — SENNOSIDES-DOCUSATE SODIUM 8.6-50 MG PO TABS: 2.0000 | ORAL_TABLET | ORAL | 0 refills | Status: DC

## 2017-11-03 MED ORDER — IBUPROFEN 600 MG PO TABS: 600.0000 mg | ORAL_TABLET | Freq: Three times a day (TID) | ORAL | 0 refills | Status: AC

## 2017-11-03 NOTE — Lactation Note (Signed)
This note was copied from a baby's chart. Lactation Consultation Note  Patient Name: Melissa Kelly [id number] Date: 11/03/2017 Reason for consult: Follow-up assessment;Term P7, 37 hrs female infant, on billi lights Mom feeding choice is BF/ Formula Per mom, BF 15 minutes then supplement with 25 ml of Enfamil 20 kcal with iron. Per mom, infant is latching to breast well no BF concerns. Per mom, familiar with jaundice three of her children had it as infants.  Mom will call  LC if she has any questions or concerns.  Maternal Data    Feeding Feeding Type: Bottle Fed - Formula Nipple Type: Slow - flow  LATCH Score                   Interventions    Lactation Tools Discussed/Used     Consult Status Consult Status: Follow-up Date: 11/04/17 Follow-up type: In-patient    Danelle Earthly 11/03/2017, 7:04 AM

## 2017-11-03 NOTE — Discharge Summary (Signed)
OB Discharge Summary     Patient Name: Melissa Kelly DOB: 1986/01/05 MRN: [id number]  Date of admission: 11/01/2017 Delivering MD: Mirian Mo   Date of discharge: 11/03/2017  Admitting diagnosis: INDUCTION Intrauterine pregnancy: [redacted]w[redacted]d     Secondary diagnosis:  Principal Problem:   Post-dates pregnancy Active Problems:   History of herpes genitalis   Homelessness   Morbid obesity (HCC)   Hx of preeclampsia, prior pregnancy, currently pregnant   Group B streptococcal bacteriuria   Indication for care in labor or delivery  Additional problems: None      Discharge diagnosis: Term Pregnancy Delivered                                                                                                Post partum procedures:None   Augmentation: Cytotec  Complications: Precipitous Labor  Hospital course:  Induction of Labor With Vaginal Delivery   32 y.o. yo [id number] at [redacted]w[redacted]d was admitted to the hospital 11/01/2017 for induction of labor.  Indication for induction: Postdates.  Received appropriate abx therapy for GBS positive status. Patient had an uncomplicated labor course as follows: Membrane Rupture Time/Date: 4:44 PM ,11/01/2017   Intrapartum Procedures: Episiotomy: None [1]                                         Lacerations:  None [1]  Patient had delivery of a Viable infant.  Information for the patient's newborn:  Keala, Drum [[id number]]  Delivery Method: Vaginal, Spontaneous(Filed from Delivery Summary)   11/01/2017  Details of delivery can be found in separate delivery note.  Patient had a routine postpartum course. Patient is discharged home 11/03/17.  Physical exam  Vitals:   11/02/17 0526 11/02/17 1937 11/02/17 2302 11/03/17 0555  BP: 131/77 128/86 128/86 123/74  Pulse: 77 77 80 88  Resp: 18 18 20 16   Temp: 98 F (36.7 C) 98.3 F (36.8 C) 98.2 F (36.8 C) 98.4 F (36.9 C)  TempSrc: Axillary Axillary Oral   SpO2:  99%  99%  Weight:       Height:       General: alert, cooperative and no distress Lochia: appropriate Uterine Fundus: firm Incision: N/A DVT Evaluation: No evidence of DVT seen on physical exam. Negative Homan's sign. No significant calf/ankle edema. Labs: Lab Results  Component Value Date   WBC 7.0 11/01/2017   HGB 11.1 (L) 11/01/2017   HCT 33.9 (L) 11/01/2017   MCV 79.2 11/01/2017   PLT 164 11/01/2017   CMP Latest Ref Rng & Units 09/04/2017  Glucose 70 - 99 mg/dL 98  BUN 6 - 20 mg/dL 5(L)  Creatinine 1.30 - 1.00 mg/dL 8.65  Sodium 784 - 696 mmol/L 133(L)  Potassium 3.5 - 5.1 mmol/L 3.7  Chloride 98 - 111 mmol/L 104  CO2 22 - 32 mmol/L 20(L)  Calcium 8.9 - 10.3 mg/dL 2.9(B)  Total Protein 6.5 - 8.1 g/dL 6.8  Total Bilirubin 0.3 - 1.2 mg/dL 0.4  Alkaline Phos 38 -  126 U/L 48  AST 15 - 41 U/L 12(L)  ALT 0 - 44 U/L 6    Discharge instruction: per After Visit Summary and "Baby and Me Booklet".  After visit meds:  Allergies as of 11/03/2017   No Known Allergies     Medication List    STOP taking these medications   hydrOXYzine 25 MG capsule Commonly known as:  VISTARIL   valACYclovir 1000 MG tablet Commonly known as:  VALTREX     TAKE these medications   acetaminophen 325 MG tablet Commonly known as:  TYLENOL Take 2 tablets (650 mg total) by mouth every 6 (six) hours as needed for mild pain or headache.   ibuprofen 600 MG tablet Commonly known as:  ADVIL,MOTRIN Take 1 tablet (600 mg total) by mouth 3 (three) times daily.   PNV PO Take 1 tablet by mouth daily.   senna-docusate 8.6-50 MG tablet Commonly known as:  Senokot-S Take 2 tablets by mouth daily. Start taking on:  11/04/2017   Vitamin D3 20 MCG Tabs Take 1 tablet by mouth daily.       Diet: routine diet  Activity: Advance as tolerated. Pelvic rest for 6 weeks.   Outpatient follow up: 4 weeks  Follow up Appt: Future Appointments  Date Time Provider Department Center  11/09/2017 10:30 AM [facility] NURSE [facility]  None  11/30/2017  1:00 PM Constant, Gigi Gin, MD [facility] None    Postpartum contraception: Planning for Tubal Ligation  Newborn Data: Live born female  Birth Weight: 7 lb 5.1 oz (3320 g) APGAR: 9, 9  Newborn Delivery   Birth date/time:  11/01/2017 17:08:00 Delivery type:  Vaginal, Spontaneous     Baby Feeding: Bottle and Breast Disposition:home with mother when appropriate   11/03/2017 Allayne Stack, DO Family Medicine PGY-1

## 2017-11-04 ENCOUNTER — Ambulatory Visit: Payer: Self-pay

## 2017-11-04 NOTE — Lactation Note (Signed)
This note was copied from a baby's chart. Lactation Consultation Note  Patient Name: Melissa Kelly [id number] Date: 11/04/2017   [facility] Follow Up Visit:  This is a term baby at 41+0 weeks.  Mother has been discharged and baby is awaiting a bilirubin level at 1600 today.  Mother is hoping to go home later this afternoon.  Mother has been breast/bottle feeding but primarily bottle feeding by choice.  Her feeding choice on admission was breast/bottle.   She has not been pumping with the DEBP regularly but did have some milk at bedside that she pumped a short while ago.  I encouraged her to wake baby up and feed this EBM back to her to help with the jaundice level.  Mother will do this.  Her breasts are soft and non tender and she has no nipple trauma.  She plans to speak  with [facility] today.  Engorgement prevention/treatment discussed.  Mother has our OP phone number for any questions/concerns that may arise after discharge.                       Andrews Tener R Makynleigh Breslin 11/04/2017, 11:34 AM

## 2017-11-09 ENCOUNTER — Ambulatory Visit (INDEPENDENT_AMBULATORY_CARE_PROVIDER_SITE_OTHER): Payer: Medicaid Other | Admitting: Obstetrics

## 2017-11-09 VITALS — BP 136/90 | HR 71 | Ht 68.0 in | Wt 358.4 lb

## 2017-11-09 DIAGNOSIS — Z0130 Encounter for examination of blood pressure without abnormal findings: Secondary | ICD-10-CM | POA: Diagnosis not present

## 2017-11-09 DIAGNOSIS — G44201 Tension-type headache, unspecified, intractable: Secondary | ICD-10-CM | POA: Diagnosis not present

## 2017-11-09 MED ORDER — BUTALBITAL-APAP-CAFFEINE 50-325-40 MG PO TABS: 1.0000 | ORAL_TABLET | Freq: Four times a day (QID) | ORAL | 1 refills | Status: DC | PRN

## 2017-11-09 NOTE — Progress Notes (Signed)
Subjective:  Melissa Kelly is a 32 y.o. female here for BP check.   Hypertension ROS: taking medications as instructed, no medication side effects noted, no TIA's, no chest pain on exertion, no dyspnea on exertion and no swelling of ankles.  C/o headaches 6/10 x 3 days and swelling in hands at nights.   Objective:  BP 136/90   Pulse 71   Ht 5\' 8"  (1.727 m)   Wt (!) 358 lb 6.4 oz (162.6 kg)   Breastfeeding? No   BMI 54.49 kg/m   Appearance alert, well appearing, and in no distress. General exam BP noted to be well controlled today in office.    Assessment:   Blood Pressure well controlled.   Plan: Fioricet Rx  Brock Bad MD 11-09-2017

## 2017-11-30 ENCOUNTER — Ambulatory Visit: Payer: Medicaid Other | Admitting: Obstetrics and Gynecology

## 2017-12-13 ENCOUNTER — Ambulatory Visit: Payer: Medicaid Other | Admitting: Advanced Practice Midwife

## 2017-12-20 ENCOUNTER — Telehealth: Payer: Self-pay | Admitting: Advanced Practice Midwife

## 2017-12-22 ENCOUNTER — Encounter: Payer: Self-pay | Admitting: Advanced Practice Midwife

## 2018-01-14 ENCOUNTER — Encounter: Payer: Self-pay | Admitting: Obstetrics and Gynecology

## 2018-01-14 ENCOUNTER — Ambulatory Visit (INDEPENDENT_AMBULATORY_CARE_PROVIDER_SITE_OTHER): Payer: Medicaid Other | Admitting: Obstetrics and Gynecology

## 2018-01-14 VITALS — BP 128/84 | HR 81 | Wt 370.0 lb

## 2018-01-14 DIAGNOSIS — R102 Pelvic and perineal pain unspecified side: Secondary | ICD-10-CM

## 2018-01-14 DIAGNOSIS — N926 Irregular menstruation, unspecified: Secondary | ICD-10-CM

## 2018-01-14 DIAGNOSIS — Z3202 Encounter for pregnancy test, result negative: Secondary | ICD-10-CM | POA: Diagnosis not present

## 2018-01-14 DIAGNOSIS — N939 Abnormal uterine and vaginal bleeding, unspecified: Secondary | ICD-10-CM

## 2018-01-14 LAB — POCT URINALYSIS DIPSTICK
BILIRUBIN UA: NEGATIVE
Blood, UA: 1
Glucose, UA: NEGATIVE
KETONES UA: NEGATIVE
Leukocytes, UA: NEGATIVE
Nitrite, UA: NEGATIVE
Protein, UA: NEGATIVE
Spec Grav, UA: 1.01 (ref 1.010–1.025)
Urobilinogen, UA: 0.2 E.U./dL
pH, UA: 6 (ref 5.0–8.0)

## 2018-01-14 LAB — POCT URINE PREGNANCY: Preg Test, Ur: NEGATIVE

## 2018-01-14 MED ORDER — MEGESTROL ACETATE 40 MG PO TABS: 40.0000 mg | ORAL_TABLET | Freq: Two times a day (BID) | ORAL | 5 refills | Status: AC

## 2018-01-14 MED ORDER — OXYCODONE-ACETAMINOPHEN 5-325 MG PO TABS: 1.0000 | ORAL_TABLET | ORAL | 0 refills | Status: DC | PRN

## 2018-01-14 NOTE — Progress Notes (Signed)
pocRGYN pt presents for problem visit today.  Delivered 11/01/17 SVD   Pt states she has had heavy bleeding x 6 days now. Pt states her cycles have never been like this. Pt became very faint at school and complained of felling dizzy and once at home pt began to have throbbing pain.  Pain 10/10x, very fatigue , nausea , soaking a pad in 1 hr , clots this morning (size of golf ball)have been the size of a quarter.   Pt asked to leave urine sample. Pt agreeable.   LMP: 01/08/18 Contraception: None  No longer Breast feeding  Last pap: 04/16/17 WNL

## 2018-01-14 NOTE — Patient Instructions (Signed)

## 2018-01-15 ENCOUNTER — Encounter (HOSPITAL_COMMUNITY): Payer: Self-pay | Admitting: Emergency Medicine

## 2018-01-15 ENCOUNTER — Emergency Department (HOSPITAL_COMMUNITY): Payer: Medicaid Other

## 2018-01-15 ENCOUNTER — Emergency Department (HOSPITAL_COMMUNITY)
Admission: EM | Admit: 2018-01-15 | Discharge: 2018-01-15 | Disposition: A | Discharge status: Home / Self Care | Payer: Medicaid Other | Attending: Emergency Medicine | Admitting: Emergency Medicine

## 2018-01-15 DIAGNOSIS — Z79899 Other long term (current) drug therapy: Secondary | ICD-10-CM | POA: Diagnosis not present

## 2018-01-15 DIAGNOSIS — R110 Nausea: Secondary | ICD-10-CM | POA: Diagnosis not present

## 2018-01-15 DIAGNOSIS — R1011 Right upper quadrant pain: Secondary | ICD-10-CM | POA: Insufficient documentation

## 2018-01-15 LAB — COMPREHENSIVE METABOLIC PANEL
ALT: 10 U/L (ref 0–44)
AST: 14 U/L — ABNORMAL LOW (ref 15–41)
Albumin: 3.4 g/dL — ABNORMAL LOW (ref 3.5–5.0)
Alkaline Phosphatase: 68 U/L (ref 38–126)
Anion gap: 11 (ref 5–15)
BUN: 9 mg/dL (ref 6–20)
CO2: 24 mmol/L (ref 22–32)
Calcium: 8.5 mg/dL — ABNORMAL LOW (ref 8.9–10.3)
Chloride: 101 mmol/L (ref 98–111)
Creatinine, Ser: 0.89 mg/dL (ref 0.44–1.00)
GFR calc non Af Amer: >60 mL/min (ref >60)
Glucose, Bld: 89 mg/dL (ref 70–99)
Potassium: 3.7 mmol/L (ref 3.5–5.1)
Sodium: 136 mmol/L (ref 135–145)
Total Bilirubin: 1 mg/dL (ref 0.3–1.2)
Total Protein: 7.1 g/dL (ref 6.5–8.1)

## 2018-01-15 LAB — CBC
HCT: 34.9 % — ABNORMAL LOW (ref 36.0–46.0)
HEMATOCRIT: 33.9 % — AB (ref 34.0–46.6)
HEMOGLOBIN: 10.9 g/dL — AB (ref 11.1–15.9)
Hemoglobin: 10.5 g/dL — ABNORMAL LOW (ref 12.0–15.0)
MCH: 25.5 pg — ABNORMAL LOW (ref 26.6–33.0)
MCH: 24.8 pg — ABNORMAL LOW (ref 26.0–34.0)
MCHC: 32.2 g/dL (ref 31.5–35.7)
MCHC: 30.1 g/dL (ref 30.0–36.0)
MCV: 79 fL (ref 79–97)
MCV: 82.5 fL (ref 80.0–100.0)
NRBC: 0 % (ref 0.0–0.2)
Platelets: 192 10*3/uL (ref 150–450)
Platelets: 163 10*3/uL (ref 150–400)
RBC: 4.28 x10E6/uL (ref 3.77–5.28)
RBC: 4.23 MIL/uL (ref 3.87–5.11)
RDW: 13.4 % (ref 12.3–15.4)
RDW: 14 % (ref 11.5–15.5)
WBC: 6.2 10*3/uL (ref 3.4–10.8)
WBC: 7.1 10*3/uL (ref 4.0–10.5)

## 2018-01-15 LAB — I-STAT BETA HCG BLOOD, ED (MC, WL, AP ONLY)

## 2018-01-15 LAB — URINALYSIS, ROUTINE W REFLEX MICROSCOPIC
Bacteria, UA: NONE SEEN
Bilirubin Urine: NEGATIVE
Glucose, UA: NEGATIVE mg/dL
Hgb urine dipstick: NEGATIVE
Ketones, ur: NEGATIVE mg/dL
Nitrite: NEGATIVE
Protein, ur: NEGATIVE mg/dL
Specific Gravity, Urine: 1.018 (ref 1.005–1.030)
pH: 5 (ref 5.0–8.0)

## 2018-01-15 LAB — LIPASE, BLOOD: Lipase: 21 U/L (ref 11–51)

## 2018-01-15 MED ORDER — MORPHINE SULFATE (PF) 4 MG/ML IV SOLN
4.0000 mg | Freq: Once | INTRAVENOUS | Status: AC
  Administered 2018-01-15: 4 mg via INTRAVENOUS
  Filled 2018-01-15: qty 1

## 2018-01-15 MED ORDER — IOHEXOL 300 MG/ML  SOLN
100.0000 mL | Freq: Once | INTRAMUSCULAR | Status: AC | PRN
  Administered 2018-01-15: 100 mL via INTRAVENOUS

## 2018-01-15 MED ORDER — MELOXICAM 7.5 MG PO TABS: 7.5000 mg | ORAL_TABLET | Freq: Two times a day (BID) | ORAL | 0 refills | Status: AC | PRN

## 2018-01-15 MED ORDER — SODIUM CHLORIDE 0.9 % IV SOLN
INTRAVENOUS | Status: DC
  Administered 2018-01-15: 08:00:00 via INTRAVENOUS

## 2018-01-15 NOTE — Discharge Instructions (Signed)
Please see your doctor in the next week for recheck Your testing was normal - you should follow up for your ultrasound at the scheduled date Mobic twice daily for pain as needed - you may have pain related to the Megace that was prescribed (though it does help with vaginal bleeding).  ER for worsening symptoms.

## 2018-01-15 NOTE — ED Notes (Signed)
Pt verbalized understanding of d/c instructions and has no further questions, VSS, NAD. Pt received no relief from morphine injection. Dr Hyacinth MeekerMiller spoke with pt and gave prescription for mobic. Pt to continue taking medication that she was prescribed yesterday to stop menstrual bleeding in conjunction with mobic.

## 2018-01-15 NOTE — ED Triage Notes (Signed)
Pt reports heavy vaginal bleeding/cramps X1 week. Pt was seen at her OBGYN yesterday and prescribed Percocet and Megestrol Acetate. Pt reports vaginal bleeding has stopped but her abd pain/cramping has worsened. Last took Percocet PTA.

## 2018-01-15 NOTE — ED Provider Notes (Addendum)
[facility] EMERGENCY DEPARTMENT Provider Note   CSN: [id number] Arrival date & time: 01/15/18  0455     History   Chief Complaint Chief Complaint  Patient presents with  . Abdominal Pain    HPI Melissa Kelly is a 32 y.o. female.  HPI  The pt is a 32 y/o female - with recent vaginal delivery in 9/19, she states that during the pregnancy she did have significant amount of pain however this resolved after delivery and though she had a period lasting 3 days last month she got better followed by a 6-day menstrual cycle for which she was seen yesterday at the [facility] hospital and given medication to stop the bleeding as well as Percocet.  She reports that since being on this medication she has increased amounts of pain overnight, she reports that the Percocet or oxycodone makes her sleepy but does not help with the pain.  The pain was in the lower pelvis but now seems to have radiated up onto the right side of the right upper quadrant.  She is nauseated especially with trying to eat.  She had one episode of diarrhea when this started back on Thursday but that has resolved.  She denies having any prior abdominal surgical conditions and still has her gallbladder, appendix, has never had ovarian cysts and was not pregnant as of yesterday.  I have reviewed the medical records from the OB/GYN visit, they did order an ultrasound, this has not yet been performed  The patient's pain was gradual in onset, has gradually worsened over the last week, is persistent and is now become severe and associated with nausea.  She specifically denies fevers or chills.  Past Medical History:  Diagnosis Date  . Herpes   . Hyperglycemia during pregnancy 01/16/2015   Random CBG 405 on CMP.  One hour GTT 101 Hemoglobin A1c 5.1 Normal abdominal circumference and AFI on anatomy ultrasound   . Late prenatal care 01/16/2015  . Supervision of other high risk pregnancies, third trimester 01/16/2015     [facility]inic [facility] --> [facility] Prenatal Labs  Dating LMP  Blood type: --/--/O POS, O POS (11/29 1433)   Genetic Screen Too late Antibody:NEG (11/29 1433)  Anatomic [place] Normal female Rubella: 4.89 (11/29 1727)  GTT  Third trimester: 101 RPR: Non Reactive (11/29 1727)   Flu vaccine  HBsAg: Negative (11/29 1727)   TDaP vaccine    01/16/15                                        HIV:   Neg  Baby Food     Breast         Patient Active Problem List   Diagnosis Date Noted  . Morbid obesity (HCC) 04/16/2017  . Homelessness 01/23/2015  . Family history of congenital heart defect 01/17/2015  . History of herpes genitalis 01/16/2015    Past Surgical History:  Procedure Laterality Date  . NO PAST SURGERIES       OB History    Gravida  8   Para  7   Term  7   Preterm  0   AB  1   Living  7     SAB  1   TAB  0   Ectopic  0   Multiple  0   Live Births  7  Home Medications    Prior to Admission medications   Medication Sig Start Date End Date Taking? Authorizing Provider  Cholecalciferol (VITAMIN D3) 20 MCG TABS Take 1 tablet by mouth daily. 04/20/17   Sharyon Cableogers, Veronica C, CNM  ibuprofen (ADVIL,MOTRIN) 600 MG tablet Take 1 tablet (600 mg total) by mouth 3 (three) times daily. 11/03/17   Allayne StackBeard, Samantha N, DO  megestrol (MEGACE) 40 MG tablet Take 1 tablet (40 mg total) by mouth 2 (two) times daily. Can increase to two tablets twice a day in the event of heavy bleeding 01/14/18   Hermina StaggersErvin, Michael L, MD  meloxicam (MOBIC) 7.5 MG tablet Take 1 tablet (7.5 mg total) by mouth 2 (two) times daily as needed for pain. 01/15/18   Eber HongMiller, Rudie Sermons, MD  Misc Natural Products (APPLE CIDER VINEGAR DIET PO) Take by mouth.    [provider]  Omega-3 Fatty Acids (FISH OIL PO) Take by mouth.    [provider]  oxyCODONE-acetaminophen (PERCOCET/ROXICET) 5-325 MG tablet Take 1 tablet by mouth every 4 (four) hours as needed for severe pain. 01/14/18   Hermina StaggersErvin, Michael L, MD  Prenatal Vit  w/Fe-Methylfol-FA (PNV PO) Take 1 tablet by mouth daily.     [provider]    Family History Family History  Problem Relation Age of Onset  . Tuberculosis Mother   . Stroke Father     Social History Social History   Tobacco Use  . Smoking status: Never Smoker  . Smokeless tobacco: Never Used  Substance Use Topics  . Alcohol use: Not Currently    Comment: occasional  . Drug use: No     Allergies   Patient has no known allergies.   Review of Systems Review of Systems  All other systems reviewed and are negative.    Physical Exam Updated Vital Signs BP 118/65 (BP Location: Right Wrist)   Pulse 77   Temp 98.4 F (36.9 C) (Oral)   Resp 16   Ht 1.727 m (5\' 8" )   Wt (!) 167.8 kg   LMP 01/08/2018 (Exact Date)   SpO2 99%   BMI 56.26 kg/m   Physical Exam Vitals signs and nursing note reviewed.  Constitutional:      General: She is not in acute distress.    Appearance: She is well-developed.  HENT:     Head: Normocephalic and atraumatic.     Mouth/Throat:     Pharynx: No oropharyngeal exudate.  Eyes:     General: No scleral icterus.       Right eye: No discharge.        Left eye: No discharge.     Conjunctiva/sclera: Conjunctivae normal.     Pupils: Pupils are equal, round, and reactive to light.  Neck:     Musculoskeletal: Normal range of motion and neck supple.     Thyroid: No thyromegaly.     Vascular: No JVD.  Cardiovascular:     Rate and Rhythm: Normal rate and regular rhythm.     Heart sounds: Normal heart sounds. No murmur. No friction rub. No gallop.   Pulmonary:     Effort: Pulmonary effort is normal. No respiratory distress.     Breath sounds: Normal breath sounds. No wheezing or rales.  Abdominal:     General: Bowel sounds are normal. There is no distension.     Palpations: Abdomen is soft. There is no mass.     Tenderness: There is no abdominal tenderness.     Comments: Very obese,  tenderness to the right upper and right mid  abdomen radiating down into the suprapubic area.  Minimal tenderness on the left, very soft abdomen, no guarding or peritoneal signs  Musculoskeletal: Normal range of motion.        General: No tenderness.  Lymphadenopathy:     Cervical: No cervical adenopathy.  Skin:    General: Skin is warm and dry.     Findings: No erythema or rash.  Neurological:     Mental Status: She is alert.     Coordination: Coordination normal.  Psychiatric:        Behavior: Behavior normal.      ED Treatments / Results  Labs (all labs ordered are listed, but only abnormal results are displayed) Labs Reviewed  COMPREHENSIVE METABOLIC PANEL - Abnormal; Notable for the following components:      Result Value   Calcium 8.5 (*)    Albumin 3.4 (*)    AST 14 (*)    All other components within normal limits  CBC - Abnormal; Notable for the following components:   Hemoglobin 10.5 (*)    HCT 34.9 (*)    MCH 24.8 (*)    All other components within normal limits  URINALYSIS, ROUTINE W REFLEX MICROSCOPIC - Abnormal; Notable for the following components:   Leukocytes, UA TRACE (*)    All other components within normal limits  LIPASE, BLOOD  I-STAT BETA HCG BLOOD, ED ([facility], [facility], [facility] ONLY)    EKG None  Radiology Ct Abdomen Pelvis W Contrast  Result Date: 01/15/2018 CLINICAL DATA:  Vaginal bleeding for 1 week. EXAM: CT ABDOMEN AND PELVIS WITH CONTRAST TECHNIQUE: Multidetector CT imaging of the abdomen and pelvis was performed using the standard protocol following bolus administration of intravenous contrast. CONTRAST:  OMNIPAQUE IOHEXOL 300 MG/ML  SOLN COMPARISON:  None FINDINGS: Lower chest: There is subsegmental atelectasis in the right middle lobe. Patchy areas of ground-glass attenuation and mild airspace consolidation noted in the lung bases bilaterally. Hepatobiliary: No focal liver abnormality is seen. No gallstones, gallbladder wall thickening, or biliary dilatation. Pancreas: Unremarkable. No  pancreatic ductal dilatation or surrounding inflammatory changes. Spleen: Normal in size without focal abnormality. Adrenals/Urinary Tract: Normal appearance of the adrenal glands. The kidneys are unremarkable. No mass or hydronephrosis. The urinary bladder appears normal. Stomach/Bowel: Stomach is within normal limits. Appendix appears normal. No evidence of bowel wall thickening, distention, or inflammatory changes. Vascular/Lymphatic: No significant vascular findings are present. No enlarged abdominal or pelvic lymph nodes. Reproductive: Uterus and bilateral adnexa are unremarkable. Other: No abdominal wall hernia or abnormality. No abdominopelvic ascites. Musculoskeletal: No acute or significant osseous findings. IMPRESSION: 1. No acute findings identified within the abdomen or pelvis. 2. The patient is presenting with abnormal uterine bleeding of unknown etiology the study of choice would be a pelvic sonogram. 3. Patchy areas of ground-glass attenuation and mild airspace densities within the lung base bilaterally. Electronically Signed   By: Signa Kell M.D.   On: 01/15/2018 09:38    Procedures Procedures (including critical care time)  Medications Ordered in ED Medications  0.9 %  sodium chloride infusion ( Intravenous New Bag/Given 01/15/18 0825)  morphine 4 MG/ML injection 4 mg (4 mg Intravenous Given 01/15/18 0826)  iohexol (OMNIPAQUE) 300 MG/ML solution 100 mL (100 mLs Intravenous Contrast Given 01/15/18 0845)     Initial Impression / Assessment and Plan / ED Course  I have reviewed the triage vital signs and the nursing notes.  Pertinent labs & imaging results  that were available during my care of the patient were reviewed by me and considered in my medical decision making (see chart for details).  Clinical Course as of Jan 16 944  Sat Jan 15, 2018  1610 CT scan reviewed with the patient, labs are unremarkable including no leukocytosis, no significant metabolic abnormalities,  normal liver function test urinalysis negative pregnancy normal lipase and a CT scan that shows no signs of acute surgical pathology.  The patient will be discharged home to follow-up for her outpatient ultrasound, I do not think she needs an emergent pelvic ultrasound today.  She has opiate medications for pain, will add a prescription strength anti-inflammatory.   [BM]    Clinical Course User Index [BM] Eber Hong, MD   I have reviewed the labs from yesterday as well as today, there is no leukocytosis, she has mild anemia, electrolytes were unremarkable as were the liver function test and the lipase.  Her status is being not pregnant has been confirmed.  At this time I think the patient needs further evaluation with advanced imaging as she has tenderness up and down the right side of her abdomen.  This is not focal to the right upper quadrant and she does not have a Murphy sign thus I think a CT scan would be a better choice initially.  IV fluids and pain medications will be given.  The patient is agreeable to the plan.  As to the findings of possible ground glass opacities on the lungs - this does not correlate with a wbc elevation, fever, cough or any SOB - unlikely to be a pna without any other sx.  Final Clinical Impressions(s) / ED Diagnoses   Final diagnoses:  Right upper quadrant abdominal pain    ED Discharge Orders         Ordered    meloxicam (MOBIC) 7.5 MG tablet  2 times daily PRN     01/15/18 0943           Eber Hong, MD 01/15/18 0945    Eber Hong, MD 01/15/18 [phone]

## 2018-01-17 NOTE — Progress Notes (Signed)
Patient ID: Delbert HarnessSharlyne Cancel, female   DOB: 02/07/1985, 32 y.o.   MRN: [id number] Ms Rexene AlbertsBalthazard presents with c/o heavy painful cycle. Current cycle started 01/08/18. She continues to bleed, heavy with cramps and clots. Second cycle since delivery. Ccylce did start 1 week late. No contraception. Not breast feeding. Stopped 1 month ago Feels tired but no dizziness, HA or SOB. Has tried Motrin x 1 without relief.  PE AF VSS Obese female in NAD  Lungs clear Heart RRR Abd soft + BS GU nl EGBUS bladder non tender blood and clots noted in vaginal vault. Uterus small slightly tender no adnexal masses or tenderness but exam limited by pt habitus.  A/P Menorrhagia        Dysmenorrhea.    Will start Megace for bleeding. Check cbc and GYN U/S. F/U in 1 month.

## 2018-01-20 ENCOUNTER — Ambulatory Visit (HOSPITAL_COMMUNITY): Payer: Medicaid Other | Attending: Obstetrics and Gynecology

## 2018-02-03 ENCOUNTER — Other Ambulatory Visit: Payer: Self-pay | Admitting: Obstetrics and Gynecology

## 2018-02-03 DIAGNOSIS — N939 Abnormal uterine and vaginal bleeding, unspecified: Secondary | ICD-10-CM

## 2018-02-08 ENCOUNTER — Encounter: Payer: Self-pay | Admitting: Obstetrics and Gynecology

## 2018-02-08 ENCOUNTER — Ambulatory Visit: Payer: Medicaid Other | Admitting: Obstetrics and Gynecology

## 2018-02-08 DIAGNOSIS — N939 Abnormal uterine and vaginal bleeding, unspecified: Secondary | ICD-10-CM | POA: Insufficient documentation

## 2018-02-08 DIAGNOSIS — R102 Pelvic and perineal pain: Secondary | ICD-10-CM | POA: Diagnosis not present

## 2018-02-08 MED ORDER — DESOGESTREL-ETHINYL ESTRADIOL 0.15-30 MG-MCG PO TABS: 1.0000 | ORAL_TABLET | Freq: Every day | ORAL | 11 refills | Status: AC

## 2018-02-08 NOTE — Progress Notes (Signed)
Patient ID: Melissa Kelly, female   DOB: [birth year], 33 y.o.   MRN: [id number] Here for follow up of AUB. Bleeding resolved with Megace. However returned when she forgot her medication x 1 week. Has restarted and bleeding has stopped. Still c/o right sided pain U/S scheduled for tomorrow  PE AF VSS Lungs clear  Heart RRR Abd soft + BS obese  A/P AUB        Pelvic pain To complete Megace and then switch to Apri. U/R/B and back up method reviewed with pt. F/U in 3 months or sooner if indicated by U/S results

## 2018-02-08 NOTE — Progress Notes (Signed)
Patient is in the office to follow up after visit on 01/14/18 for AUB. Pt states that she started on Megace and symptoms were better, but pt states that she went out of town and forgot the rx so she missed a week and symptoms have returned despite her continuing to take it now. Pt reports r sided pain under her ribs, no bleeding today. Pain is worse with deep breathing, sneezing.

## 2018-02-08 NOTE — Patient Instructions (Signed)
Abnormal Uterine Bleeding Abnormal uterine bleeding means bleeding more than usual from your uterus. It can include:  Bleeding between periods.  Bleeding after sex.  Bleeding that is heavier than normal.  Periods that last longer than usual.  Bleeding after you have stopped having your period (menopause). There are many problems that may cause this. You should see a doctor for any kind of bleeding that is not normal. Treatment depends on the cause of the bleeding. Follow these instructions at home:  Watch your condition for any changes.  Do not use tampons, douche, or have sex, if your doctor tells you not to.  Change your pads often.  Get regular well-woman exams. Make sure they include a pelvic exam and cervical cancer screening.  Keep all follow-up visits as told by your doctor. This is important. Contact a doctor if:  The bleeding lasts more than one week.  You feel dizzy at times.  You feel like you are going to throw up (nauseous).  You throw up. Get help right away if:  You pass out.  You have to change pads every hour.  You have belly (abdominal) pain.  You have a fever.  You get sweaty.  You get weak.  You passing large blood clots from your vagina. Summary  Abnormal uterine bleeding means bleeding more than usual from your uterus.  There are many problems that may cause this. You should see a doctor for any kind of bleeding that is not normal.  Treatment depends on the cause of the bleeding. This information is not intended to replace advice given to you by your health care provider. Make sure you discuss any questions you have with your health care provider. Document Released: 11/16/2008 Document Revised: 01/14/2016 Document Reviewed: 01/14/2016 Elsevier Interactive Patient Education  2019 [employer] Oral Contraception Use Oral contraceptive pills (OCPs) are medicines that you take to prevent pregnancy. OCPs work by:  Preventing the ovaries  from releasing eggs.  Thickening mucus in the lower part of the uterus (cervix), which prevents sperm from entering the uterus.  Thinning the lining of the uterus (endometrium), which prevents a fertilized egg from attaching to the endometrium. OCPs are highly effective when taken exactly as prescribed. However, OCPs do not prevent sexually transmitted infections (STIs). Safe sex practices, such as using condoms while on an OCP, can help prevent STIs. Before taking OCPs, you may have a physical exam, blood test, and Pap test. A Pap test involves taking a sample of cells from your cervix to check for cancer. Discuss with your health care provider the possible side effects of the OCP you may be prescribed. When you start an OCP, be aware that it can take 2-3 months for your body to adjust to changes in hormone levels. How to take oral contraceptive pills Follow instructions from your health care provider about how to start taking your first cycle of OCPs. Your health care provider may recommend that you:  Start the pill on day 1 of your menstrual period. If you start at this time, you will not need any backup form of birth control (contraception), such as condoms.  Start the pill on the first Sunday after your menstrual period or on the day you get your prescription. In these cases, you will need to use backup contraception for the first week.  Start the pill at any time of your cycle. ? If you take the pill within 5 days of the start of your period, you will not need  a backup form of contraception. ? If you start at any other time of your menstrual cycle, you will need to use another form of contraception for 7 days. If your OCP is the type called a minipill, it will protect you from pregnancy after taking it for 2 days (48 hours), and you can stop using backup contraception after that time. After you have started taking OCPs:  If you forget to take 1 pill, take it as soon as you remember. Take the  next pill at the regular time.  If you miss 2 or more pills, call your health care provider. Different pills have different instructions for missed doses. Use backup birth control until your next menstrual period starts.  If you use a 28-day pack that contains inactive pills and you miss 1 of the last 7 pills (pills with no hormones), throw away the rest of the non-hormone pills and start a new pill pack. No matter which day you start the OCP, you will always start a new pack on that same day of the week. Have an extra pack of OCPs and a backup contraceptive method available in case you miss some pills or lose your OCP pack. Follow these instructions at home:  Do not use any products that contain nicotine or tobacco, such as cigarettes and e-cigarettes. If you need help quitting, ask your health care provider.  Always use a condom to protect against STIs. OCPs do not protect against STIs.  Use a calendar to mark the days of your menstrual period.  Read the information and directions that came with your OCP. Talk to your health care provider if you have questions. Contact a health care provider if:  You develop nausea and vomiting.  You have abnormal vaginal discharge or bleeding.  You develop a rash.  You miss your menstrual period. Depending on the type of OCP you are taking, this may be a sign of pregnancy. Ask your health care provider for more information.  You are losing your hair.  You need treatment for mood swings or depression.  You get dizzy when taking the OCP.  You develop acne after taking the OCP.  You become pregnant or think you may be pregnant.  You have diarrhea, constipation, and abdominal pain or cramps.  You miss 2 or more pills. Get help right away if:  You develop chest pain.  You develop shortness of breath.  You have an uncontrolled or severe headache.  You develop numbness or slurred speech.  You develop visual or speech problems.  You  develop pain, redness, and swelling in your legs.  You develop weakness or numbness in your arms or legs. Summary  Oral contraceptive pills (OCPs) are medicines that you take to prevent pregnancy.  OCPs do not prevent sexually transmitted infections (STIs). Always use a condom to protect against STIs.  When you start an OCP, be aware that it can take 2-3 months for your body to adjust to changes in hormone levels.  Read all the information and directions that come with your OCP. This information is not intended to replace advice given to you by your health care provider. Make sure you discuss any questions you have with your health care provider. Document Released: 01/08/2011 Document Revised: 03/02/2016 Document Reviewed: 03/02/2016 Elsevier Interactive Patient Education  2019 [employer].

## 2018-02-09 ENCOUNTER — Ambulatory Visit (HOSPITAL_COMMUNITY): Payer: Medicaid Other

## 2018-03-23 NOTE — Telephone Encounter (Signed)
Error

## 2019-03-13 ENCOUNTER — Other Ambulatory Visit: Payer: Self-pay | Admitting: Obstetrics and Gynecology

## 2019-03-13 DIAGNOSIS — N926 Irregular menstruation, unspecified: Secondary | ICD-10-CM

## 2019-03-13 DIAGNOSIS — N939 Abnormal uterine and vaginal bleeding, unspecified: Secondary | ICD-10-CM

## 2020-11-22 IMAGING — CT CT ABD-PELV W/ CM
2 of 4 series · 16 of 46 positions shown, 18 images · IV contrast (APPLIED)
Comparison: None

CLINICAL DATA: Vaginal bleeding for 1 week.

EXAM:
CT ABDOMEN AND PELVIS WITH CONTRAST
TECHNIQUE: Multidetector CT imaging of the abdomen and pelvis was performed
using the standard protocol following bolus administration of
intravenous contrast.
CONTRAST:  100mL OMNIPAQUE IOHEXOL 300 MG/ML  SOLN

[Series 3: abdomen 5.0 · axial · 0.79mm/px · z∈[+1169,+1624]mm · 13 of 101 slices shown, 15 images]
[im 5/101  soft-tissue]
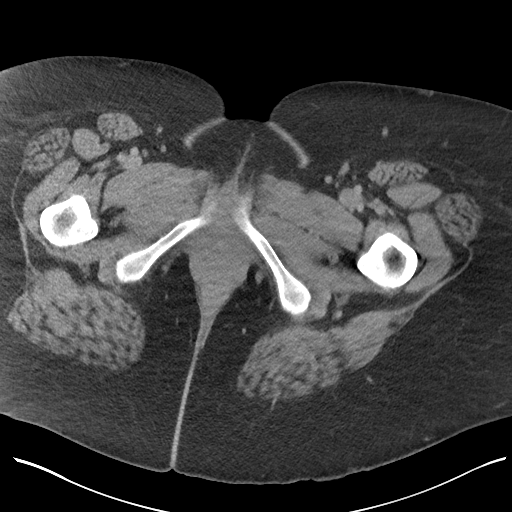
[im 5/101  bone]
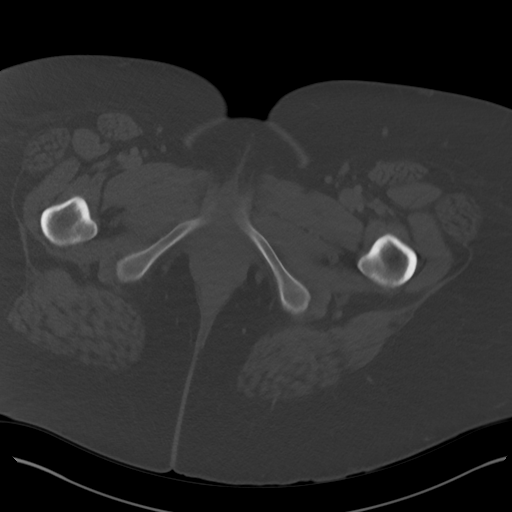
[im 14/101  soft-tissue]
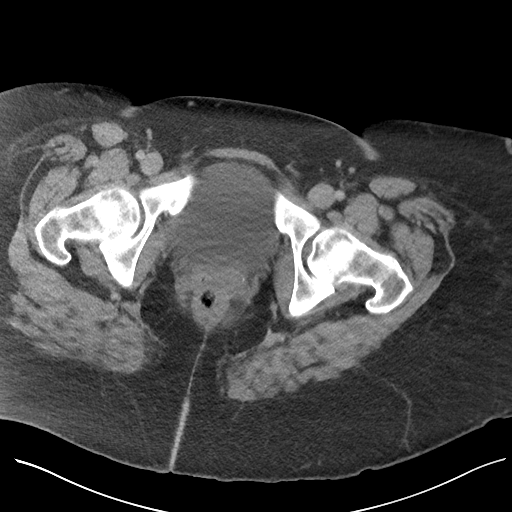
[im 23/101  soft-tissue]
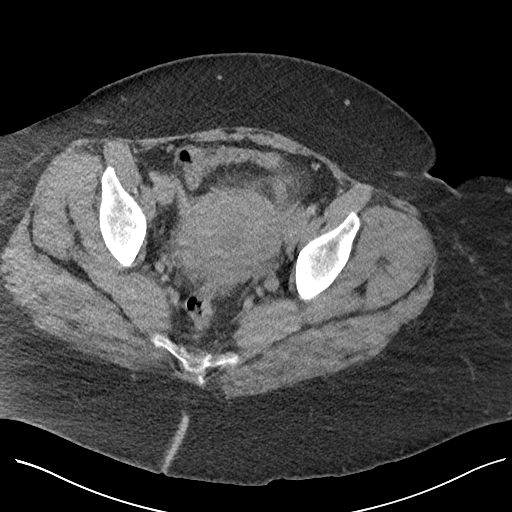
[im 28/101  soft-tissue]
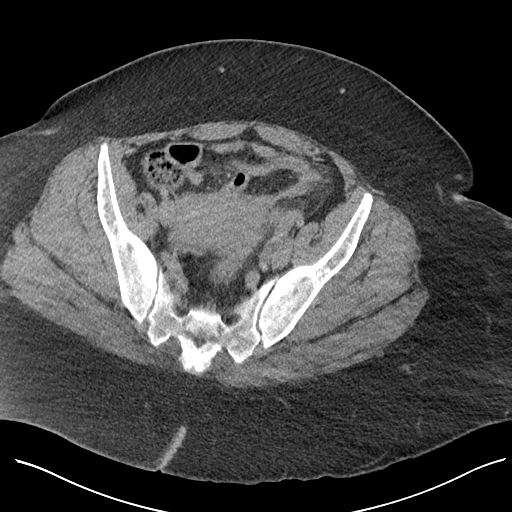
[im 37/101  soft-tissue]
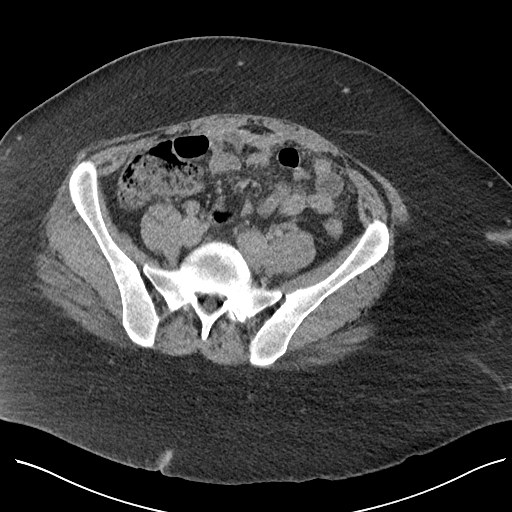
[im 41/101  soft-tissue]
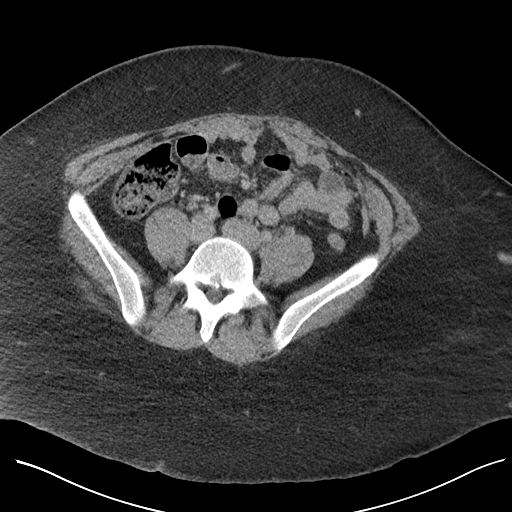
[im 51/101  soft-tissue]
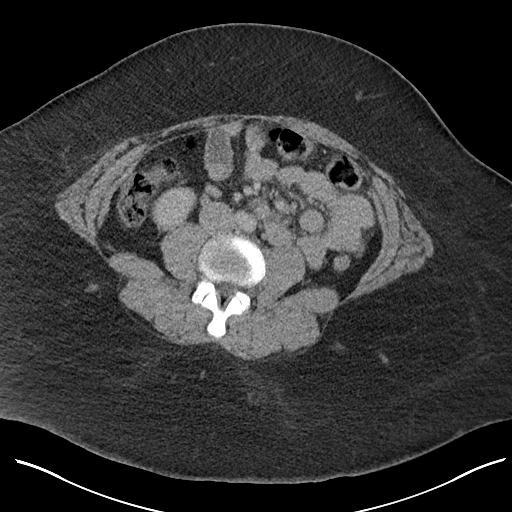
[im 60/101  soft-tissue]
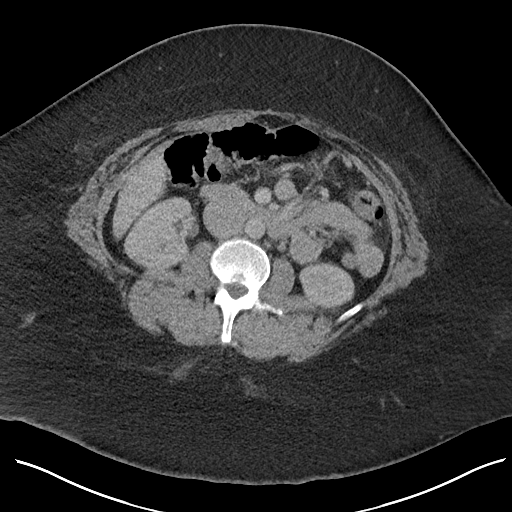
[im 64/101  soft-tissue]
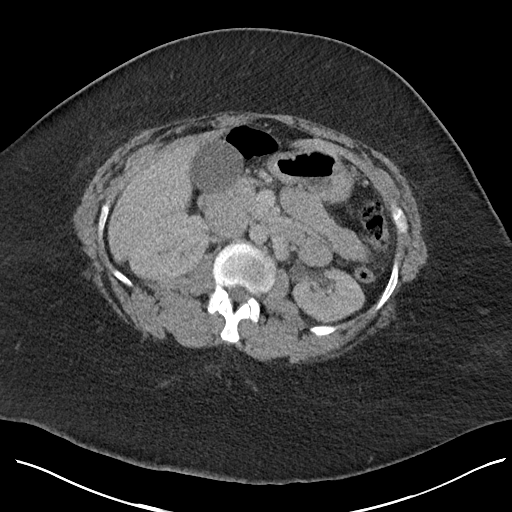
[im 64/101  bone]
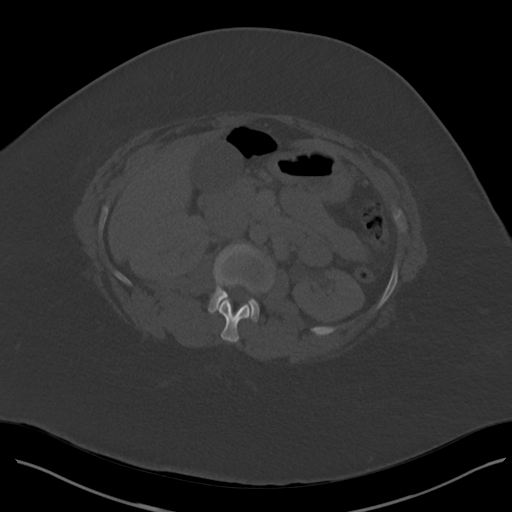
[im 73/101  soft-tissue]
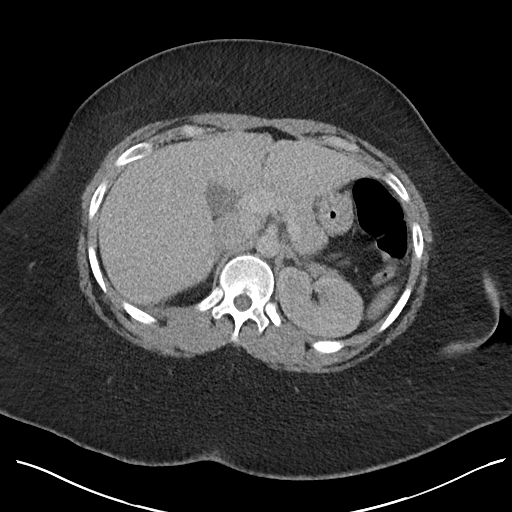
[im 78/101  soft-tissue]
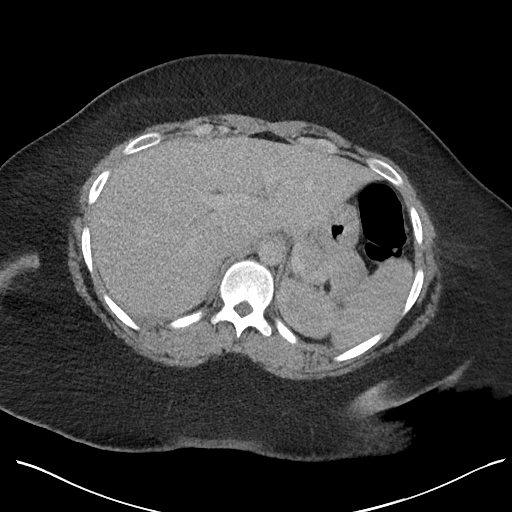
[im 87/101  soft-tissue]
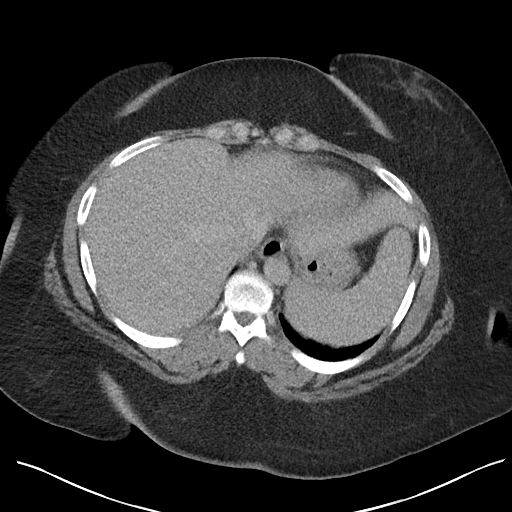
[im 96/101  soft-tissue]
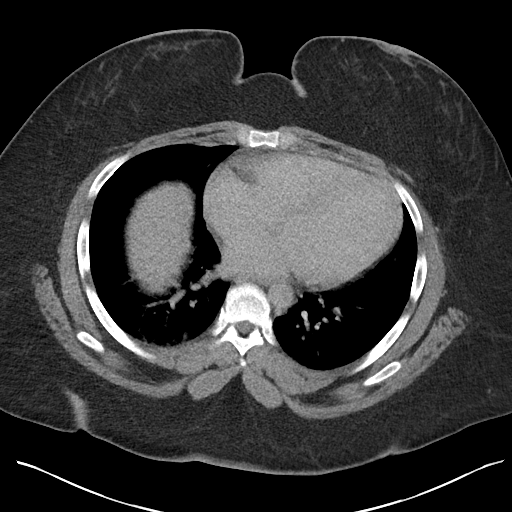

[Series 6: abdomen 3.0 mpr cor · coronal · 0.73mm/px · 3 of 87 slices shown]
[im 29/87  soft-tissue]
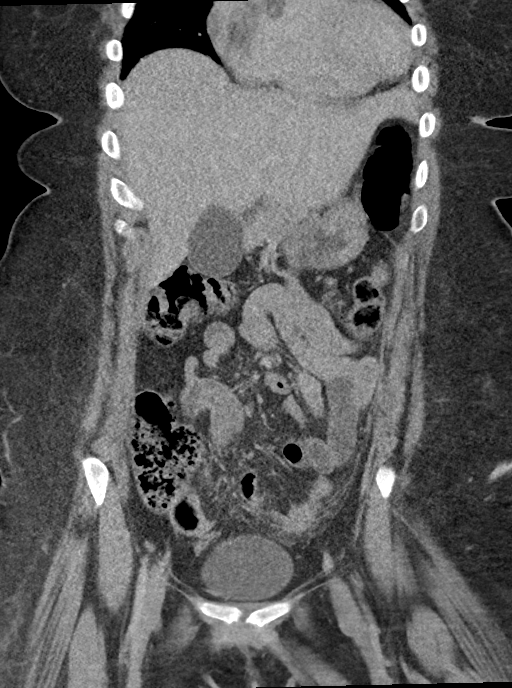
[im 39/87  soft-tissue]
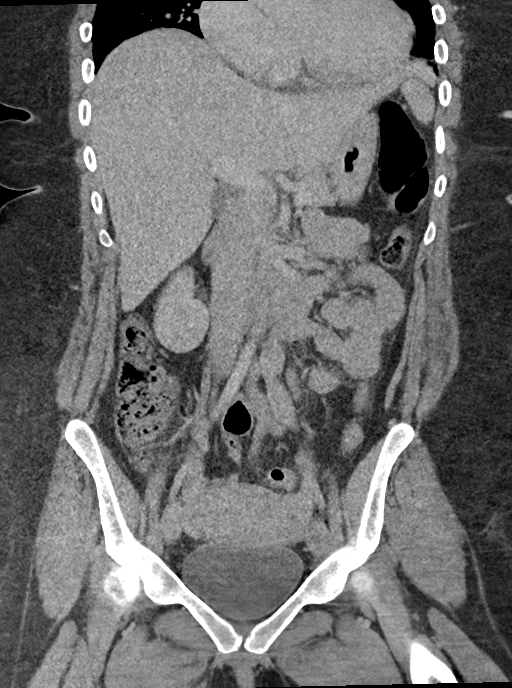
[im 48/87  soft-tissue]
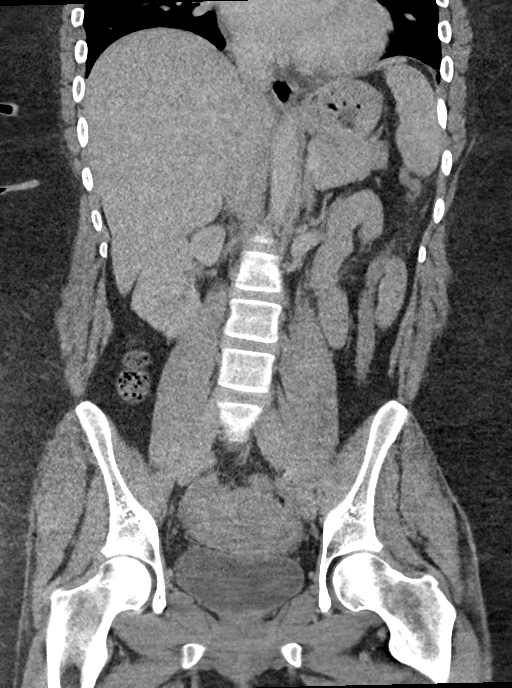

[16 of 46 positions shown; findings below may reference images not displayed]

FINDINGS: Lower chest: There is subsegmental atelectasis in the right middle
lobe. Patchy areas of ground-glass attenuation and mild airspace
consolidation noted in the lung bases bilaterally.

Hepatobiliary: No focal liver abnormality is seen. No gallstones,
gallbladder wall thickening, or biliary dilatation.

Pancreas: Unremarkable. No pancreatic ductal dilatation or
surrounding inflammatory changes.

Spleen: Normal in size without focal abnormality.

Adrenals/Urinary Tract: Normal appearance of the adrenal glands. The
kidneys are unremarkable. No mass or hydronephrosis. The urinary
bladder appears normal.

Stomach/Bowel: Stomach is within normal limits. Appendix appears
normal. No evidence of bowel wall thickening, distention, or
inflammatory changes.

Vascular/Lymphatic: No significant vascular findings are present. No
enlarged abdominal or pelvic lymph nodes.

Reproductive: Uterus and bilateral adnexa are unremarkable.

Other: No abdominal wall hernia or abnormality. No abdominopelvic
ascites.

Musculoskeletal: No acute or significant osseous findings.
IMPRESSION: 1. No acute findings identified within the abdomen or pelvis.
2. The patient is presenting with abnormal uterine bleeding of
unknown etiology the study of choice would be a pelvic sonogram.
3. Patchy areas of ground-glass attenuation and mild airspace
densities within the lung base bilaterally.
